# Patient Record
Sex: Female | Born: 1975
Health system: Southern US, Community
[De-identification: ages and names within clinical notes are randomized; demographics above are authoritative.]

## PROBLEM LIST (undated history)

## (undated) DIAGNOSIS — G8929 Other chronic pain: Secondary | ICD-10-CM

## (undated) DIAGNOSIS — M542 Cervicalgia: Secondary | ICD-10-CM

## (undated) DIAGNOSIS — G43909 Migraine, unspecified, not intractable, without status migrainosus: Secondary | ICD-10-CM

## (undated) HISTORY — DX: Other chronic pain: G89.29

## (undated) HISTORY — PX: NASAL SINUS SURGERY: SHX719

## (undated) HISTORY — PX: HAND SURGERY: SHX662

## (undated) HISTORY — DX: Migraine, unspecified, not intractable, without status migrainosus: G43.909

## (undated) HISTORY — DX: Cervicalgia: M54.2

---

## 2004-05-04 ENCOUNTER — Other Ambulatory Visit: Admission: RE | Admit: 2004-05-04 | Discharge: 2004-05-04 | Payer: Self-pay | Admitting: Obstetrics and Gynecology

## 2005-06-15 ENCOUNTER — Other Ambulatory Visit: Admission: RE | Admit: 2005-06-15 | Discharge: 2005-06-15 | Payer: Self-pay | Admitting: Obstetrics and Gynecology

## 2010-02-19 ENCOUNTER — Ambulatory Visit: Payer: Self-pay | Admitting: Family Medicine

## 2010-02-19 DIAGNOSIS — M542 Cervicalgia: Secondary | ICD-10-CM | POA: Insufficient documentation

## 2010-02-19 DIAGNOSIS — G43011 Migraine without aura, intractable, with status migrainosus: Secondary | ICD-10-CM | POA: Insufficient documentation

## 2010-03-03 ENCOUNTER — Encounter: Admission: RE | Admit: 2010-03-03 | Discharge: 2010-06-01 | Payer: Self-pay | Admitting: Family Medicine

## 2010-03-26 ENCOUNTER — Ambulatory Visit: Payer: Self-pay | Admitting: Family Medicine

## 2010-03-26 DIAGNOSIS — G43909 Migraine, unspecified, not intractable, without status migrainosus: Secondary | ICD-10-CM | POA: Insufficient documentation

## 2010-04-09 ENCOUNTER — Encounter: Payer: Self-pay | Admitting: Family Medicine

## 2010-04-13 ENCOUNTER — Encounter: Payer: Self-pay | Admitting: Family Medicine

## 2010-12-09 NOTE — Miscellaneous (Signed)
Summary: PT Discharge/[facility]  PT Discharge/[facility]   Imported By: Lanelle Bal 04/20/2010 08:02:32  _____________________________________________________________________  External Attachment:    Type:   Image     Comment:   External Document

## 2010-12-09 NOTE — Assessment & Plan Note (Signed)
Summary: migraines   Vital Signs:  Patient profile:   35 year old female Height:      68 inches Weight:      194 pounds BMI:     29.60 O2 Sat:      100 % on Room air Pulse rate:   81 / minute BP sitting:   122 / 80  (left arm) Cuff size:   large  Vitals Entered By: Payton Spark CMA (Mar 26, 2010 10:22 AM)  O2 Flow:  Room air CC: F/u migraines. Discuss meds. Also c/o allergies.    Primary Care Provider:  Nani Gasser MD  CC:  F/u migraines. Discuss meds. Also c/o allergies. .  History of Present Illness: 35 yo WF presents for f/u migraine q 1-2 wks.  She started PT 1 month ago which has started helping her neck pain.  She is getting mild to moderate HA 2 x a wk.  She is having some allergy problems.  She takes benadryl everyday for flare ups and she is on immunotherapy.  The Flexeril did help her HA/ neck pain.  Her sumatriptan did help but it made her feel 'spacy' for 2 days afterward.  She has seen a chiropractor on and off thru the years after a fall of a horse in middle school.  Xrays have shown loss of cervical lordosis.  She is using a TENS unit.      Current Medications (verified): 1)  Cyclobenzaprine Hcl 10 Mg Tabs (Cyclobenzaprine Hcl) .... Take 1 Tablet By Mouth Three Times A Day As Needed Muscle Spasm. 2)  Sumatriptan Succinate 100 Mg Tabs (Sumatriptan Succinate) .... Take 1 Tablet By Mouth Once A Day As Needed For Migraine. Can Repeat The Dose in 1-2 Hours If Needed.  Allergies (verified): 1)  ! Sulfa  Past History:  Past Medical History: migraines  chronic neck pain  Social History: Reviewed history from 02/19/2010 and no changes required. 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 Married to [place] with 3 kids ( 1 set of twins).  Never Smoked Alcohol use-no Drug use-no Regular exercise-no  Review of Systems      See HPI  Physical Exam  General:  alert, well-developed, well-nourished, well-hydrated, and overweight-appearing.   Head:   normocephalic and atraumatic.   Eyes:  pupils equal, pupils round, and pupils reactive to light.   Ears:  no external deformities.   Nose:  no nasal discharge.   Mouth:  good dentition and pharynx pink and moist.   Neck:  supple, full ROM, and no masses.  tight trapezious muscles.  tender subocciptal muscles Lungs:  Normal respiratory effort, chest expands symmetrically. Lungs are clear to auscultation, no crackles or wheezes. Heart:  Normal rate and regular rhythm. S1 and S2 normal without gallop, murmur, click, rub or other extra sounds. Neurologic:  no tremor Skin:  color normal.   Cervical Nodes:  No lymphadenopathy noted Psych:  good eye contact, not anxious appearing, and not depressed appearing.     Impression & Recommendations:  Problem # 1:  MIGRAINE HEADACHE (ICD-346.90) She was having SEs from the Sumatriptan so I changed her to Naratriptan for rescue.  Continue to use Flexeril along with it and use Excedrin migraine for mild to moderate HAs.  Since her total HA count/month is >10, will start prophylactic treatment with Topamax.  She will start at 25 mg at night x 1 wk then go up to 50 mg dose.  Call if any problems.  RTC in 2  mos. Her updated medication list for this problem includes:    Naratriptan Hcl 2.5 Mg Tabs (Naratriptan hcl) .Marland Kitchen... 1 tab by mouth x 1 as needed migraine headache; repeat in 4 hrs if needed.  Problem # 2:  NECK PAIN (ICD-723.1) In PT.  slightly improved with PT/ tens unit and Flexeril.  she may need to get back into chiropractor for problems iwth loss of cervical lordosis.  Explained that neck pain can certainly be a trigger for her migraines. Her updated medication list for this problem includes:    Cyclobenzaprine Hcl 10 Mg Tabs (Cyclobenzaprine hcl) .Marland Kitchen... Take 1 tablet by mouth three times a day as needed muscle spasm.  Complete Medication List: 1)  Cyclobenzaprine Hcl 10 Mg Tabs (Cyclobenzaprine hcl) .... Take 1 tablet by mouth three times a day as  needed muscle spasm. 2)  Naratriptan Hcl 2.5 Mg Tabs (Naratriptan hcl) .Marland Kitchen.. 1 tab by mouth x 1 as needed migraine headache; repeat in 4 hrs if needed. 3)  Topiramate 50 Mg Tabs (Topiramate) .... 1/2 tab by mouth at bedtime x 1 wk then 1 tab by mouth qhs  Patient Instructions: 1)  Start Topiramate at bedtime daily for migraine prevention. 2)  Use Naratriptan + Flexeril at onset of severe migraine headache. 3)  Use Excedrin migraine at onset of mild to moderate headache. 4)  Continue PT for neck pain. 5)  Add Nasonex daily for allergies and f/u with Dr Corinda Gubler. 6)  Return for f/u migraines in 6 wks. Prescriptions: TOPIRAMATE 50 MG TABS (TOPIRAMATE) 1/2 tab by mouth at bedtime x 1 wk then 1 tab by mouth qhs  #30 x 1   Entered and Authorized by:   Seymour Bars DO   Signed by:   Seymour Bars DO on 03/26/2010   Method used:   Electronically to        [facility]  [id number] (retail)       [address]       [place], [place]  [address]       Ph: [phone]       Fax: [phone]   RxID:   [id number] NARATRIPTAN HCL 2.5 MG TABS (NARATRIPTAN HCL) 1 tab by mouth x 1 as needed migraine headache; repeat in 4 hrs if needed.  #9 x 1   Entered and Authorized by:   Seymour Bars DO   Signed by:   Seymour Bars DO on 03/26/2010   Method used:   Electronically to        [facility] [phone]* (retail)       [address]       [place], [place]  [address]       Ph: [phone]       Fax: [phone]   RxID:   [id number] CYCLOBENZAPRINE HCL 10 MG TABS (CYCLOBENZAPRINE HCL) Take 1 tablet by mouth three times a day as needed muscle spasm.  #40 x 1   Entered and Authorized by:   Seymour Bars DO   Signed by:   Seymour Bars DO on 03/26/2010   Method used:   Electronically to        [facility] [phone]* (retail)       [address]       [place], [place]  [address]       Ph: [phone]       Fax: [phone]   RxID:   [id number]

## 2010-12-09 NOTE — Miscellaneous (Signed)
Summary: [facility]/ [facility]  PT Initial [facility]   Imported By: Lanelle Bal 04/20/2010 07:56:42  _____________________________________________________________________  External Attachment:    Type:   Image     Comment:   External Document

## 2010-12-09 NOTE — Assessment & Plan Note (Signed)
Summary: NOV: Neck pain, migraine   Vital Signs:  Patient profile:   35 year old female Height:      68 inches Weight:      193 pounds BMI:     29.45 Pulse rate:   70 / minute BP sitting:   106 / 66  (left arm) Cuff size:   large  Vitals Entered By: Sonya Lucas (February 19, 2010 2:13 PM) CC: NP_ left sided neck pain- triggers headaches ? stress at work, Headache Is Patient Diabetic? No   Primary Care Provider:  Nani Gasser MD  CC:  NP_ left sided neck pain- triggers headaches ? stress at work and Headache.  History of Present Illness: NP_ left sided neck pain- triggers headaches ? stress at work. Bucked off a hoarse years ago and had a neck injury.  Occ neck will pop and will get very tight muscles that results in migraine.  HAs seen neurology adn a chiropracter.  Using excedrin migraine to take the edge off.  Work has been stressful.  Has had a HA all weak at this point. Using a TENS unit and heat which is helping. Feels tense and tight. On a computer all day at work.  Usually get 2 HA a month.  Some nausea, dizziness, slurred speech.  No vomiting. Light and sounds sensitivity. Dull throbbing behind her left eye. Not sure has tried tryptan.   Habits & Providers  Alcohol-Tobacco-Diet     Alcohol drinks/day: <1     Tobacco Status: never  Exercise-Depression-Behavior     Does Patient Exercise: no     Have you felt down or hopeless? yes     STD Risk: never     Drug Use: no     Seat Belt Use: always  Current Medications (verified): 1)  None  Allergies (verified): 1)  ! Sulfa  Comments:  Nurse/Medical Assistant: The patient's medications and allergies were reviewed with the patient and were updated in the Medication and Allergy Lists. 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  Never  Smoked Alcohol use-no Drug use-no Regular exercise-no Smoking Status:  never Does Patient Exercise:  no STD Risk:  never Drug Use:  no Seat Belt Use:  always  Review of Systems       No fever/sweats/weakness, unexplained weight loss/gain.  No vison changes.  No difficulty hearing/ringing in ears, hay fever/allergies.  No chest pain/discomfort, palpitations.  No Br lump/nipple discharge.  No cough/wheeze.  No blood in BM, nausea/vomiting/diarrhea.  No nighttime urination, leaking urine, unusual vaginal bleeding, discharge (penis or vagina).  No muscle/joint pain. No rash, change in mole.  + HA, no memory loss.  No anxiety, sleep d/o, depression.  No easy bruising/bleeding, unexplained lump.    Physical Exam  General:  Well-developed,well-nourished,in no acute distress; alert,appropriate and cooperative throughout examination Head:  Normocephalic and atraumatic without obvious abnormalities. No apparent alopecia or balding. Eyes:  No corneal or conjunctival inflammation noted. EOMI. Perrla. Lungs:  Normal respiratory effort, chest expands symmetrically. Lungs are clear to auscultation, no crackles or wheezes. Heart:  Normal rate and regular rhythm. S1 and S2 normal without gallop, murmur, click, rub or other extra sounds. Msk:  Neck with normal flexion, extenstion. Slight dec rotation to the left and normal side bending. Tender at the base of  the skul and over the traps bilat. They are also tendse and tighht.   Pulses:  Radial 2+ bilat.  Skin:  no rashes.   Cervical Nodes:  No lymphadenopathy noted Psych:  Cognition and judgment appear intact. Alert and cooperative with normal attention span and concentration. No apparent delusions, illusions, hallucinations   Impression & Recommendations:  Problem # 1:  MIGRAINE W/O AURA W/INTRACT W/STATUS MIGRAINOSUS (ICD-346.13)  Discussed treatment options.  Trial of imitrex.  Will refer to PT to work on her chronic neck pain. Also discussed  potential role of trigger point in her neck.  For her acute HA will try flexeril and the imitrex and see if helps.  F/U in 1 months.  Her updated medication list for this problem includes:    Sumatriptan Succinate 100 Mg Tabs (Sumatriptan succinate) .Sonya Lucas... Take 1 tablet by mouth once a day as needed for migraine. can repeat the dose in 1-2 hours if needed.  Orders: Physical Therapy Referral (PT)  Problem # 2:  NECK PAIN (ICD-723.1)  Will refer to PT. Trial of muscle relaxer three times a day as needed . Discussed  importance of lookgin at the long term solution to her pain and I think this is where pt will help. In meantime continue the use of heat and the TENs unit.   Her updated medication list for this problem includes:    Cyclobenzaprine Hcl 10 Mg Tabs (Cyclobenzaprine hcl) .Sonya Lucas... Take 1 tablet by mouth three times a day as needed muscle spasm.  Orders: Physical Therapy Referral (PT)  Complete Medication List: 1)  Cyclobenzaprine Hcl 10 Mg Tabs (Cyclobenzaprine hcl) .... Take 1 tablet by mouth three times a day as needed muscle spasm. 2)  Sumatriptan Succinate 100 Mg Tabs (Sumatriptan succinate) .... Take 1 tablet by mouth once a day as needed for migraine. can repeat the dose in 1-2 hours if needed.  Patient Instructions: 1)  Please schedule a follow-up appointment in 1 month to see how migraines are doing 2)  Call me Monday if not feeling better.  Prescriptions: SUMATRIPTAN SUCCINATE 100 MG TABS (SUMATRIPTAN SUCCINATE) Take 1 tablet by mouth once a day as needed for migraine. Can repeat the dose in 1-2 hours if needed.  #6 x 0   Entered and Authorized by:   Sonya Gasser MD   Signed by:   Sonya Gasser MD on 02/19/2010   Method used:   Electronically to        [facility]  [address] [phone]* (retail)       [address]       [place], [place]  [address]       Ph: [phone]       Fax: [phone]   RxID:   [id number] CYCLOBENZAPRINE HCL 10 MG TABS (CYCLOBENZAPRINE HCL)  Take 1 tablet by mouth three times a day as needed muscle spasm.  #30 x 0   Entered and Authorized by:   Sonya Gasser MD   Signed by:   Sonya Gasser MD on 02/19/2010   Method used:   Electronically to        [facility]  [address] [phone]* (retail)       [address]       [place], [place]  [address]       Ph: [phone]       Fax: [phone]   RxID:   [id number]

## 2010-12-09 NOTE — Letter (Signed)
Summary: Out of Work  [facility]  [address]   [place], [place] [address]   Phone: [phone]  Fax: [phone]    February 19, 2010   Employee:  [employer]    To Whom It May Concern:   For Medical reasons, please excuse the above named employee from work for the following dates:  Start:   02/19/2010  End:   02/23/2010  If you need additional information, please feel free to contact our office.         Sincerely,    Nani Gasser MD

## 2011-03-30 LAB — HM PAP SMEAR

## 2011-07-27 ENCOUNTER — Encounter: Payer: Self-pay | Admitting: Family Medicine

## 2011-07-28 ENCOUNTER — Ambulatory Visit (INDEPENDENT_AMBULATORY_CARE_PROVIDER_SITE_OTHER): Payer: BC Managed Care – PPO | Admitting: Family Medicine

## 2011-07-28 ENCOUNTER — Encounter: Payer: Self-pay | Admitting: Family Medicine

## 2011-07-28 DIAGNOSIS — J45909 Unspecified asthma, uncomplicated: Secondary | ICD-10-CM | POA: Insufficient documentation

## 2011-07-28 DIAGNOSIS — J40 Bronchitis, not specified as acute or chronic: Secondary | ICD-10-CM

## 2011-07-28 DIAGNOSIS — J309 Allergic rhinitis, unspecified: Secondary | ICD-10-CM | POA: Insufficient documentation

## 2011-07-28 MED ORDER — HYDROCODONE-HOMATROPINE 5-1.5 MG/5ML PO SYRP: 5.0000 mL | ORAL_SOLUTION | Freq: Every evening | ORAL | Status: AC | PRN

## 2011-07-28 MED ORDER — ALBUTEROL SULFATE (2.5 MG/3ML) 0.083% IN NEBU
2.5000 mg | INHALATION_SOLUTION | Freq: Four times a day (QID) | RESPIRATORY_TRACT | Status: DC | PRN
  Administered 2011-07-28: 2.5 mg via RESPIRATORY_TRACT

## 2011-07-28 MED ORDER — FLUTICASONE PROPIONATE 50 MCG/ACT NA SUSP: 2.0000 | Freq: Every day | NASAL | Status: DC

## 2011-07-28 MED ORDER — AZITHROMYCIN 250 MG PO TABS: ORAL_TABLET | ORAL | Status: AC

## 2011-07-28 MED ORDER — NARATRIPTAN HCL 2.5 MG PO TABS: 2.5000 mg | ORAL_TABLET | ORAL | Status: DC | PRN

## 2011-07-28 NOTE — Patient Instructions (Signed)
Call if not better in 10 days.   

## 2011-07-28 NOTE — Progress Notes (Signed)
  Subjective:    Patient ID: Sonya Lucas, female    DOB: Dec 11, 1975, 35 y.o.   MRN: [id number]  Cough The current episode started in the past 7 days. The problem has been gradually worsening. The problem occurs hourly. Associated symptoms include chest pain, postnasal drip, rhinorrhea and shortness of breath. Pertinent negatives include no ear congestion, ear pain, fever, myalgias or sore throat. Exacerbated by: talking. Treatments tried: benadryl. The treatment provided mild relief. Her past medical history is significant for asthma.      Review of Systems  Constitutional: Negative for fever.  HENT: Positive for rhinorrhea and postnasal drip. Negative for ear pain and sore throat.   Respiratory: Positive for cough and shortness of breath.   Cardiovascular: Positive for chest pain.  Musculoskeletal: Negative for myalgias.       Objective:   Physical Exam  Constitutional: She is oriented to person, place, and time. She appears well-developed and well-nourished.  HENT:  Head: Normocephalic and atraumatic.  Right Ear: External ear normal.  Left Ear: External ear normal.  Nose: Nose normal.  Mouth/Throat: Oropharynx is clear and moist.       TMs and canals are clear.   Eyes: Conjunctivae and EOM are normal. Pupils are equal, round, and reactive to light.  Neck: Neck supple. No thyromegaly present.  Cardiovascular: Normal rate, regular rhythm and normal heart sounds.   Pulmonary/Chest: Effort normal and breath sounds normal. She has no wheezes.  Lymphadenopathy:    She has no cervical adenopathy.  Neurological: She is alert and oriented to person, place, and time.  Skin: Skin is warm and dry.  Psychiatric: She has a normal mood and affect.          Assessment & Plan:  Bronchitis-this could be viral. Patient had a lot of postnasal drip. Her peak flow is in the yellow zone today so we could give her nebulizer treatment. Her peak flow improved from 310-370. We gave her a sample  albuterol inhaler to use at home. 2-4 puffs every 4 hours as needed for chest tightness, wheezing or shortness of breath. Because she is leaving town for a week I also gave her prescription for an antibiotic to fill if she suddenly gets worse. Also recommend getting her back on her allergy medications as it has clearly been a trigger for her. I recommend over-the-counter Claritin, Zyrtec or Allegra. I also sent a prescription for generic nasal steroid spray.

## 2011-07-29 ENCOUNTER — Telehealth: Payer: Self-pay | Admitting: Family Medicine

## 2011-07-29 NOTE — Telephone Encounter (Signed)
Pt called in this am and said she was in yesterday and a cough control med was suppose to have been sent yesterday, and when she went to pup it was not at the pharm. Now, the husband is stopping by the office to check on. Plan:  While the husband is here called the [facility] pharm and  The medication is at the pharm.  Pts husband informed. Jarvis Newcomer, LPN [facility]

## 2011-08-11 ENCOUNTER — Telehealth: Payer: Self-pay | Admitting: Family Medicine

## 2011-08-11 MED ORDER — AMOXICILLIN-POT CLAVULANATE 875-125 MG PO TABS: 1.0000 | ORAL_TABLET | Freq: Two times a day (BID) | ORAL | Status: AC

## 2011-08-11 NOTE — Telephone Encounter (Signed)
Pt informed. Maynard David, LPN /Triage  

## 2011-08-11 NOTE — Telephone Encounter (Signed)
Ok will call in new ABX. Also need to add antihistamine.

## 2011-08-11 NOTE — Telephone Encounter (Signed)
Pt calling stating she was seen two weeks ago and was told to call back if no better.  C/O cough to point of vomiting, hard to breathe, afebrile however.  Pt did feel the antibiotic and took that, and used nasal spray, but not using the albuterol inhaler because it immediately makes her cough, and no antihistamine taken as recommended. Please advise if pt needs to be brought back in this am or sent to UC or to call add'l med. Plan:  Routed to Dr. Marlyne Beards, LPN [facility]

## 2011-10-20 ENCOUNTER — Encounter: Payer: Self-pay | Admitting: *Deleted

## 2011-10-20 ENCOUNTER — Emergency Department
Admission: EM | Admit: 2011-10-20 | Discharge: 2011-10-20 | Disposition: A | Discharge status: Home / Self Care | Payer: BC Managed Care – PPO | Source: Home / Self Care | Attending: Family Medicine | Admitting: Family Medicine

## 2011-10-20 DIAGNOSIS — R05 Cough: Secondary | ICD-10-CM

## 2011-10-20 DIAGNOSIS — R053 Chronic cough: Secondary | ICD-10-CM

## 2011-10-20 DIAGNOSIS — J111 Influenza due to unidentified influenza virus with other respiratory manifestations: Secondary | ICD-10-CM

## 2011-10-20 DIAGNOSIS — R6889 Other general symptoms and signs: Secondary | ICD-10-CM

## 2011-10-20 DIAGNOSIS — R059 Cough, unspecified: Secondary | ICD-10-CM

## 2011-10-20 MED ORDER — CLARITHROMYCIN 500 MG PO TABS: 500.0000 mg | ORAL_TABLET | Freq: Two times a day (BID) | ORAL | Status: AC

## 2011-10-20 MED ORDER — BENZONATATE 200 MG PO CAPS: 200.0000 mg | ORAL_CAPSULE | Freq: Every day | ORAL | Status: AC

## 2011-10-20 NOTE — ED Provider Notes (Signed)
History     CSN: [id number] Arrival date & time: 10/20/2011 10:05 AM   First MD Initiated Contact with Patient 10/20/11 1030      Chief Complaint  Patient presents with  . Otalgia  . Cough    HPI Comments: HPI : Flu symptoms for about 5 days. Fever with chills, sweats, myalgias, fatigue, headache. Symptoms are progressively worsening, despite trying OTC fever reducing medicine and rest and fluids. Has decreased appetite, but tolerating some liquids by mouth.  She reports that prior to present illness, she had had a persistent non-productive cough for at least a month, responding poorly to albuterol inhaler.  She sometimes coughs until she gags.  She does not remember her last tetanus shot.  Review of Systems: Positive for fatigue, mild nasal congestion, mild sore throat, mild swollen anterior neck glands, mild cough.  Developed right earache today Negative for acute vision changes, stiff neck, focal weakness, syncope, seizures, respiratory distress, vomiting, diarrhea, GU symptoms.   Patient is a 35 y.o. female presenting with ear pain and cough. The history is provided by the patient.  Otalgia Associated symptoms include cough.  Cough Associated symptoms include ear pain.    Past Medical History  Diagnosis Date  . Migraines   . Chronic neck pain     Past Surgical History  Procedure Date  . Hand surgery     right  . Nasal sinus surgery     Family History  Problem Relation Age of Onset  . Breast cancer      grandmother  . Diabetes      grandfather  . Hyperlipidemia Father     History  Substance Use Topics  . Smoking status: Never Smoker   . Smokeless tobacco: Not on file  . Alcohol Use: No    OB History    Grav Para Term Preterm Abortions TAB SAB Ect Mult Living                  Review of Systems  HENT: Positive for ear pain.   Respiratory: Positive for cough.     Allergies  Sulfonamide derivatives  Home Medications   Current Outpatient Rx    Name Route Sig Dispense Refill  . BENZONATATE 200 MG PO CAPS Oral Take 1 capsule (200 mg total) by mouth at bedtime. Take as needed for cough 12 capsule 0  . CLARITHROMYCIN 500 MG PO TABS Oral Take 1 tablet (500 mg total) by mouth 2 (two) times daily. 14 tablet 0  . FLUTICASONE PROPIONATE 50 MCG/ACT NA SUSP Nasal Place 2 sprays into the nose daily. 16 g 6  . NARATRIPTAN HCL 2.5 MG PO TABS Oral Take 1 tablet (2.5 mg total) by mouth as needed. Take one (1) tablet at onset of headache; if returns or does not resolve, may repeat after 4 hours; do not exceed five (5) mg in 24 hours. 10 tablet 2    BP 119/80  Pulse 78  Temp(Src) 98.7 F (37.1 C) (Oral)  Resp 14  Ht 5\' 8"  (1.727 m)  Wt 189 lb (85.73 kg)  BMI 28.74 kg/m2  SpO2 99%  Physical Exam Nursing notes and Vital Signs reviewed. Appearance:  Patient appears healthy, stated age, and in no acute distress Eyes:  Pupils are equal, round, and reactive to light and accomodation.  Extraocular movement is intact.  Conjunctivae are not inflamed  Ears:  Canals normal.  Tympanic membranes normal.  Nose:  Mildly congested turbinates.  No sinus tenderness.  Pharynx:  Normal  Neck:  Supple.  Slightly tender shotty posterior nodes are palpated bilaterally  Lungs:  Clear to auscultation.  Breath sounds are equal.  Heart:  Regular rate and rhythm without murmurs, rubs, or gallops. Chest:  Distinct tenderness to palpation over the mid-sternum.  Abdomen:  Nontender without masses or hepatosplenomegaly.  Bowel sounds are present.  No CVA or flank tenderness.  Extremities:  No edema.  No calf tenderness Skin:  No rash present.   ED Course  Procedures  none      1. Influenza-like illness   2. Persistent cough       MDM  Suspect that her chronic cough may represent ? Pertussis Begin Biaxin.  Begin expectorant/decongestant, topical decongestant, saline nasal spray and/or saline irrigation, and cough suppressant at bedtime.  Avoid  antihistamines for now. Increase fluid intake, rest. Recommend Tdap and flu shot when well. Followup with PCP if not improving 7 to 10 days.         Donna Christen, MD 10/20/11 1101

## 2011-10-20 NOTE — ED Notes (Signed)
Patient c/o ear pain dry cough, body aches, chills and congestion x 5 days.

## 2012-01-10 ENCOUNTER — Ambulatory Visit (INDEPENDENT_AMBULATORY_CARE_PROVIDER_SITE_OTHER): Payer: BC Managed Care – PPO | Admitting: Physician Assistant

## 2012-01-10 ENCOUNTER — Encounter: Payer: Self-pay | Admitting: Physician Assistant

## 2012-01-10 VITALS — BP 122/86 | HR 101 | Temp 97.7°F | Ht 68.0 in | Wt 194.0 lb

## 2012-01-10 DIAGNOSIS — J40 Bronchitis, not specified as acute or chronic: Secondary | ICD-10-CM

## 2012-01-10 DIAGNOSIS — R062 Wheezing: Secondary | ICD-10-CM

## 2012-01-10 MED ORDER — HYDROCODONE-HOMATROPINE 5-1.5 MG/5ML PO SYRP: 2.5000 mL | ORAL_SOLUTION | Freq: Every evening | ORAL | Status: AC | PRN

## 2012-01-10 MED ORDER — AZITHROMYCIN 250 MG PO TABS: ORAL_TABLET | ORAL | Status: AC

## 2012-01-10 NOTE — Progress Notes (Signed)
  Subjective:    Patient ID: Sonya Lucas, female    DOB: 02/02/1976, 36 y.o.   MRN: [id number]  HPI Patient presents to the clinic today because she has not felt great for 2 months. She has had on and off again congestion both nasal and chest. Her cough has gotten worse for the last 7 days.her cough is productive with thick clears sputum with occasional yellow tinting. She does report increasing shortness of breath mostly at night. She has had to use her albuterol inhaler on average once every 2 days. She states that albuterol helps some. She has also used Dayquil and Sudafed.both of these have helped minimally.  She denies fever, nausea, vomiting, or headache. She has had some chills and muscle aches.  Review of Systems     Objective:   Physical Exam  Constitutional: She is oriented to person, place, and time. She appears well-developed and well-nourished.  HENT:  Head: Normocephalic and atraumatic.  Right Ear: External ear normal.  Left Ear: External ear normal.  Nose: Nose normal.  Mouth/Throat: Oropharynx is clear and moist. No oropharyngeal exudate.       TMs normal. Negative for maxillary tenderness to palpation.  Eyes: Conjunctivae are normal.  Neck:       Enlarged anterior cervical lymph nodes tenderness on the left side to palpation.  Cardiovascular: Regular rhythm and normal heart sounds.        Tachycardia at 101.  Pulmonary/Chest: Effort normal.       Course breath sounds bilaterally with wheezing heard with expiration and left upper lobe.  Neurological: She is alert and oriented to person, place, and time.  Psychiatric: She has a normal mood and affect. Her behavior is normal.          Assessment & Plan:  Bronchitis-patient was given Zpak. Hycodan for cough to use at bedtime.patient encouraged to use Mucinex twice a day for the next 2 days and drink lots of water. Encouraged her cough is much she could during the day and only use cough syrup at night. followup if not  improving minutes 3 days her by Friday.  Discussed need for complete physical. Patient is aware.

## 2012-01-10 NOTE — Patient Instructions (Signed)
Start Zpak today for bronchitis. Use albuterol inhaler as needed for shortness of breath. Continue Mucinex for next two days twice a day drinking lots of water. Use hycodan at night for cough.

## 2012-01-13 ENCOUNTER — Telehealth: Payer: Self-pay | Admitting: *Deleted

## 2012-01-13 NOTE — Telephone Encounter (Signed)
Pt called back and states that her and her husband will be transferring to another family provider. States that she doesn't think it is right for her to have to come back in tomorrow to f/u with Jade instead of getting another medication. States the last time she got sick that we kept sending in multiple meds and about "killed her with all of the meds we gave her.  Pt will not be following up tomorrow with Lesly Rubenstein.  FYI

## 2012-01-13 NOTE — Telephone Encounter (Signed)
Pt seen Jade on Monday and was given a zpack. Pt states that her throat is very sore and states that her rt ear still hurting putting bad today. Pt would like to know what else we can do. Please advise.

## 2012-01-13 NOTE — Telephone Encounter (Signed)
OK, she can do what she feels is best for her care.

## 2012-01-13 NOTE — Telephone Encounter (Signed)
Pt informed

## 2012-01-13 NOTE — Telephone Encounter (Signed)
Haver her f/u tomorrow w/ Lesly Rubenstein for ear recheck.

## 2012-12-14 DIAGNOSIS — J343 Hypertrophy of nasal turbinates: Secondary | ICD-10-CM | POA: Insufficient documentation

## 2013-06-15 ENCOUNTER — Ambulatory Visit (INDEPENDENT_AMBULATORY_CARE_PROVIDER_SITE_OTHER): Payer: BC Managed Care – PPO | Admitting: Family Medicine

## 2013-06-15 ENCOUNTER — Encounter: Payer: Self-pay | Admitting: Family Medicine

## 2013-06-15 VITALS — BP 121/80 | HR 81 | Wt 213.0 lb

## 2013-06-15 DIAGNOSIS — F411 Generalized anxiety disorder: Secondary | ICD-10-CM

## 2013-06-15 LAB — TSH: TSH: 3.091 u[IU]/mL (ref 0.350–4.500)

## 2013-06-15 MED ORDER — CITALOPRAM HYDROBROMIDE 20 MG PO TABS: ORAL_TABLET | ORAL | Status: DC

## 2013-06-15 NOTE — Progress Notes (Signed)
CC: Sonya Lucas is a 37 y.o. female is here for Stress   Subjective: HPI:  Patient complains of stress and anxiety that has been present for at least the last month worsening on a weekly basis and is now moderate in severity on a daily basis. It is worsened with recent job changes, she is no longer over seeing a project she's been working on for years however is somehow still being held responsible for new short falls that are occurring despite her clearly not being at fault. She finds herself more irritable at home, small stressors from her children are getting in the way of her quality of life when in the past this was never a problem. Stress often causes her to feel subjectively ill and dizzy and she has even missed work on occasion because of these symptoms. Symptoms are improved with the support of her husband. She's also noticed that she's having more hair fall out that she's used to since the stress has worsened. She is having sleep disturbances waking early in the morning with worries about job tasks that still need to be completed.    She denies fevers, chills, unintentional weight loss or gain, GI disturbance, depression, nor thoughts of wanting to harm herself or others. There is no recreational drug use she rarely drinks alcohol   Review Of Systems Outlined In HPI  Past Medical History  Diagnosis Date  . Migraines   . Chronic neck pain      Family History  Problem Relation Age of Onset  . Breast cancer      grandmother  . Diabetes      grandfather  . Hyperlipidemia Father      History  Substance Use Topics  . Smoking status: Never Smoker   . Smokeless tobacco: Not on file  . Alcohol Use: No     Objective: Filed Vitals:   06/15/13 0815  BP: 121/80  Pulse: 81    Vital signs reviewed. General: Alert and Oriented, No Acute Distress HEENT: Pupils equal, round, reactive to light. Conjunctivae clear.  External ears unremarkable.  Moist mucous membranes. Lungs:  Clear and comfortable work of breathing, speaking in full sentences without accessory muscle use. Cardiac: Regular rate and rhythm.  Neuro: CN II-XII grossly intact, gait normal. Extremities: No peripheral edema.  Strong peripheral pulses.  Mental Status: No depression, anxiety, nor agitation. Logical though process. Skin: Warm and dry. Assessment & Plan: Sonya Lucas was seen today for stress.  Diagnoses and associated orders for this visit:  Generalized anxiety disorder - citalopram (CELEXA) 20 MG tablet; Half tab by mouth daily for four days then full tab daily. - TSH    Generalized anxiety disorder: We discussed formal therapy along with medication interventions she would prefer to start with citalopram and is agreeable to checking TSH especially in light of hair loss.  Return in about 4 weeks (around 07/13/2013).

## 2013-07-06 ENCOUNTER — Ambulatory Visit: Payer: BC Managed Care – PPO | Admitting: Family Medicine

## 2014-02-25 ENCOUNTER — Encounter: Payer: Self-pay | Admitting: Physician Assistant

## 2014-02-25 ENCOUNTER — Ambulatory Visit (INDEPENDENT_AMBULATORY_CARE_PROVIDER_SITE_OTHER): Payer: BC Managed Care – PPO | Admitting: Physician Assistant

## 2014-02-25 VITALS — BP 152/79 | HR 103 | Temp 98.2°F | Ht 68.0 in | Wt 212.0 lb

## 2014-02-25 DIAGNOSIS — H1045 Other chronic allergic conjunctivitis: Secondary | ICD-10-CM

## 2014-02-25 DIAGNOSIS — J309 Allergic rhinitis, unspecified: Secondary | ICD-10-CM

## 2014-02-25 DIAGNOSIS — J302 Other seasonal allergic rhinitis: Secondary | ICD-10-CM | POA: Insufficient documentation

## 2014-02-25 DIAGNOSIS — J45909 Unspecified asthma, uncomplicated: Secondary | ICD-10-CM

## 2014-02-25 DIAGNOSIS — H1013 Acute atopic conjunctivitis, bilateral: Secondary | ICD-10-CM

## 2014-02-25 MED ORDER — BECLOMETHASONE DIPROPIONATE 80 MCG/ACT NA AERS: 2.0000 | INHALATION_SPRAY | Freq: Every day | NASAL | Status: DC

## 2014-02-25 MED ORDER — AZELASTINE HCL 0.05 % OP SOLN: 1.0000 [drp] | Freq: Two times a day (BID) | OPHTHALMIC | Status: DC

## 2014-02-25 MED ORDER — METHYLPREDNISOLONE SODIUM SUCC 125 MG IJ SOLR
125.0000 mg | Freq: Once | INTRAMUSCULAR | Status: AC
  Administered 2014-02-25: 125 mg via INTRAMUSCULAR

## 2014-02-25 MED ORDER — MONTELUKAST SODIUM 10 MG PO TABS: 10.0000 mg | ORAL_TABLET | Freq: Every day | ORAL | Status: DC

## 2014-02-25 NOTE — Progress Notes (Signed)
   Subjective:    Patient ID: Sonya Lucas, female    DOB: Jul 03, 1976, 38 y.o.   MRN: [id number]  HPI Pt is a 38 yo female who presents to the clinic with bilateral itchy, watery eyes, sinus pressure, ST and cough. She has a hx of asthma but denies any wheezing. She is taking OTC [employer] brand anti-histamine with little to no relief. Symptoms start about 4 days ago. She has really bad sesonal allergies. She used to get 2 allergy shots a week. Both children have had allergy exacerbations that have since cleared. Sinus pressure seems to be improving today after taking a decongestant. Her itchy, draining eye seem to be the most concerning symptoms today. She reports this morning yellowish strings of discharge. No eye pain or vision changes. She has been using warm compresses.  Denies any fever, chills, nausea.     Review of Systems     Objective:   Physical Exam  Constitutional: She is oriented to person, place, and time. She appears well-developed and well-nourished.  HENT:  Head: Normocephalic and atraumatic.  Right Ear: External ear normal.  Left Ear: External ear normal.  TM's clear.  Oropharynx erythematous with PND. No tonsilar exudate or swelling.  Bilateral nasal turbinates red and swollen.   Eyes:  Swollen around bilateral eyes. Bilateral injected conjunctiva. Discharge bilateral seemed to be clear and watery at visit.   Neck: Normal range of motion. Neck supple.  Cardiovascular: Regular rhythm and normal heart sounds.   Tachycardia at 103.   Pulmonary/Chest: Effort normal and breath sounds normal. She has no wheezes.  Lymphadenopathy:    She has no cervical adenopathy.  Neurological: She is alert and oriented to person, place, and time.  Psychiatric: She has a normal mood and affect. Her behavior is normal.          Assessment & Plan:  Seasonal allergies/asthma/allergic conjunctivitis and rhinitis- treated with solumedrol 125mg  IM today. Pt needs to start Singulair which  can help with allergies and asthma symptoms. Continue with zyrtec or claritin OTC. Start qnasl daily. Start optivar for allergic eyes bid. With overall physical exam seems more allergic than bacterial. If not improving or worsening call office.

## 2014-02-25 NOTE — Patient Instructions (Signed)
Start eye drops twice a day.  Start singulair daily in combination with zyrtec/claritin.  Start qnasl daily.   Follow up if not improving.

## 2014-02-28 ENCOUNTER — Emergency Department
Admission: EM | Admit: 2014-02-28 | Discharge: 2014-02-28 | Disposition: A | Discharge status: Home / Self Care | Payer: BC Managed Care – PPO | Source: Home / Self Care | Attending: Family Medicine | Admitting: Family Medicine

## 2014-02-28 ENCOUNTER — Encounter: Payer: Self-pay | Admitting: Emergency Medicine

## 2014-02-28 ENCOUNTER — Telehealth: Payer: Self-pay | Admitting: *Deleted

## 2014-02-28 DIAGNOSIS — J069 Acute upper respiratory infection, unspecified: Secondary | ICD-10-CM

## 2014-02-28 DIAGNOSIS — J302 Other seasonal allergic rhinitis: Secondary | ICD-10-CM

## 2014-02-28 MED ORDER — CETIRIZINE HCL 10 MG PO CAPS: 10.0000 mg | ORAL_CAPSULE | Freq: Every day | ORAL | Status: DC

## 2014-02-28 MED ORDER — PREDNISONE 50 MG PO TABS: ORAL_TABLET | ORAL | Status: DC

## 2014-02-28 MED ORDER — AZITHROMYCIN 250 MG PO TABS: ORAL_TABLET | ORAL | Status: DC

## 2014-02-28 NOTE — ED Notes (Signed)
Sore throat, hoarseness

## 2014-02-28 NOTE — ED Provider Notes (Signed)
CSN: [id number]     Arrival date & time 02/28/14  1724 History   First MD Initiated Contact with Patient 02/28/14 1728     Chief Complaint  Patient presents with  . Sore Throat    HPI  URI Symptoms Onset: 5-6 days  Description: rhinorrhea, sore throat, cough  Modifying factors:  Was seen earlier in the week by PCP. Was given steroid shot and oral antihistamines. Sxs mildly improved since this point. Still with severe sore throat and drainage/cough   Symptoms Nasal discharge: yes Fever: no Sore throat: yes Cough: yes Wheezing: no Ear pain: no GI symptoms: no Sick contacts: yes  Red Flags  Stiff neck: no Dyspnea: no Rash: no Swallowing difficulty: no  Sinusitis Risk Factors Headache/face pain: no Double sickening: no tooth pain: no  Allergy Risk Factors Sneezing: yes Itchy scratchy throat: no Seasonal symptoms: yes  Flu Risk Factors Headache: no muscle aches: no severe fatigue: no   Past Medical History  Diagnosis Date  . Migraines   . Chronic neck pain    Past Surgical History  Procedure Laterality Date  . Hand surgery      right  . Nasal sinus surgery     Family History  Problem Relation Age of Onset  . Breast cancer      grandmother  . Diabetes      grandfather  . Hyperlipidemia Father    History  Substance Use Topics  . Smoking status: Never Smoker   . Smokeless tobacco: Not on file  . Alcohol Use: No   OB History   Grav Para Term Preterm Abortions TAB SAB Ect Mult Living                 Review of Systems  All other systems reviewed and are negative.   Allergies  Sulfonamide derivatives  Home Medications   Prior to Admission medications   Medication Sig Start Date End Date Taking? Authorizing Provider  azelastine (OPTIVAR) 0.05 % ophthalmic solution Place 1 drop into both eyes 2 (two) times daily. 02/25/14   Jade L Breeback, PA-C  azithromycin (ZITHROMAX) 250 MG tablet Take 2 tabs PO x 1 dose, then 1 tab PO QD x 4 days 02/28/14    Doree AlbeeSteven Amyla Heffner, MD  Beclomethasone Dipropionate (QNASL) 80 MCG/ACT AERS Place 2 sprays into both nostrils daily. 02/25/14   Jade L Breeback, PA-C  Cetirizine HCl 10 MG CAPS Take 1 capsule (10 mg total) by mouth daily. 02/28/14   Doree AlbeeSteven Jeyda Siebel, MD  montelukast (SINGULAIR) 10 MG tablet Take 1 tablet (10 mg total) by mouth at bedtime. 02/25/14   Jade L Breeback, PA-C  predniSONE (DELTASONE) 50 MG tablet 1 tab daily x 5 days 02/28/14   Doree AlbeeSteven Jaidan Stachnik, MD   BP 125/85  Pulse 80  Temp(Src) 98.2 F (36.8 C) (Oral)  Ht 5\' 8"  (1.727 m)  Wt 216 lb (97.977 kg)  BMI 32.85 kg/m2  SpO2 99% Physical Exam  Constitutional: She is oriented to person, place, and time. She appears well-developed and well-nourished.  HENT:  Head: Normocephalic and atraumatic.  Right Ear: External ear normal.  Left Ear: External ear normal.  +nasal erythema, rhinorrhea bilaterally, + post oropharyngeal erythema    Eyes: Conjunctivae are normal. Pupils are equal, round, and reactive to light.  Neck: Normal range of motion. Neck supple.  Cardiovascular: Normal rate and regular rhythm.   Pulmonary/Chest: Effort normal and breath sounds normal. She has no wheezes.  Abdominal: Soft.  Musculoskeletal: Normal range of motion.  Neurological: She is alert and oriented to person, place, and time.  Skin: Skin is warm.    ED Course  Procedures (including critical care time) Labs Review Labs Reviewed - No data to display  Results for orders placed in visit on 06/15/13  TSH      Result Value Ref Range   TSH 3.091  0.350 - 4.500 uIU/mL   Imaging Review No results found.   MDM   1. URI (upper respiratory infection)   2. Seasonal allergies    Suspect overlapping viral and allergic sxs.  Will place on extended course of prednisone.  Oral antihistamine  Discussed supportive care and infectious/resp red flags PPx rx for zpak if sxs worsen.  Follow up as needed.     The patient and/or caregiver has been counseled  thoroughly with regard to treatment plan and/or medications prescribed including dosage, schedule, interactions, rationale for use, and possible side effects and they verbalize understanding. Diagnoses and expected course of recovery discussed and will return if not improved as expected or if the condition worsens. Patient and/or caregiver verbalized understanding.         Doree AlbeeSteven Clay Menser, MD 02/28/14 1754

## 2014-02-28 NOTE — ED Notes (Signed)
Patient said sore throat started yesterday and  refused swab

## 2014-02-28 NOTE — Telephone Encounter (Signed)
Pt states that she is feeling worse since her visit Monday.  Jade saw her & ordered solu-medrol, qnasl, and for pt to start singulair.  She has been doing this & states the qnasl helps a lot but then just stops working.  Now she was a really bad sore throat & it's very painful to swallow as well as having a painful bump on the top of her mouth.

## 2014-03-01 NOTE — Telephone Encounter (Signed)
Left detailed message on vm.

## 2014-03-01 NOTE — Telephone Encounter (Signed)
Follow up with pt. methney routed to me this am. I see went to urgent care and felt symptoms were consistent with allergies. Please let me know if not improving.

## 2014-04-23 ENCOUNTER — Encounter: Payer: Self-pay | Admitting: Physician Assistant

## 2014-04-23 ENCOUNTER — Ambulatory Visit (INDEPENDENT_AMBULATORY_CARE_PROVIDER_SITE_OTHER): Payer: BC Managed Care – PPO | Admitting: Physician Assistant

## 2014-04-23 VITALS — BP 128/72 | HR 85 | Ht 68.0 in | Wt 219.0 lb

## 2014-04-23 DIAGNOSIS — R599 Enlarged lymph nodes, unspecified: Secondary | ICD-10-CM

## 2014-04-23 DIAGNOSIS — M5382 Other specified dorsopathies, cervical region: Secondary | ICD-10-CM

## 2014-04-23 DIAGNOSIS — R29898 Other symptoms and signs involving the musculoskeletal system: Secondary | ICD-10-CM

## 2014-04-23 MED ORDER — CYCLOBENZAPRINE HCL 10 MG PO TABS: 10.0000 mg | ORAL_TABLET | Freq: Three times a day (TID) | ORAL | Status: DC | PRN

## 2014-04-23 NOTE — Patient Instructions (Signed)
Cool compresses.  Ibuprofen for next week.  Will get CBC.   Swollen Lymph Nodes The lymphatic system filters fluid from around cells. It is like a system of blood vessels. These channels carry lymph instead of blood. The lymphatic system is an important part of the immune (disease fighting) system. When people talk about "swollen glands in the neck," they are usually talking about swollen lymph nodes. The lymph nodes are like the little traps for infection. You and your caregiver may be able to feel lymph nodes, especially swollen nodes, in these common areas: the groin (inguinal area), armpits (axilla), and above the clavicle (supraclavicular). You may also feel them in the neck (cervical) and the back of the head just above the hairline (occipital). Swollen glands occur when there is any condition in which the body responds with an allergic type of reaction. For instance, the glands in the neck can become swollen from insect bites or any type of minor infection on the head. These are very noticeable in children with only minor problems. Lymph nodes may also become swollen when there is a tumor or problem with the lymphatic system, such as Hodgkin's disease. TREATMENT   Most swollen glands do not require treatment. They can be observed (watched) for a short period of time, if your caregiver feels it is necessary. Most of the time, observation is not necessary.  Antibiotics (medicines that kill germs) may be prescribed by your caregiver. Your caregiver may prescribe these if he or she feels the swollen glands are due to a bacterial (germ) infection. Antibiotics are not used if the swollen glands are caused by a virus. HOME CARE INSTRUCTIONS   Take medications as directed by your caregiver. Only take over-the-counter or prescription medicines for pain, discomfort, or fever as directed by your caregiver. SEEK MEDICAL CARE IF:   If you begin to run a temperature greater than 102 F (38.9 C), or as  your caregiver suggests. MAKE SURE YOU:   Understand these instructions.  Will watch your condition.  Will get help right away if you are not doing well or get worse. Document Released: 10/15/2002 Document Revised: 01/17/2012 Document Reviewed: 10/25/2005 [facility] Patient Information 2014 [place], [place].

## 2014-04-24 LAB — CBC WITH DIFFERENTIAL/PLATELET
BASOS ABS: 0 10*3/uL (ref 0.0–0.1)
Basophils Relative: 0 % (ref 0–1)
EOS ABS: 0.2 10*3/uL (ref 0.0–0.7)
EOS PCT: 3 % (ref 0–5)
HCT: 39.4 % (ref 36.0–46.0)
Hemoglobin: 13 g/dL (ref 12.0–15.0)
Lymphocytes Relative: 36 % (ref 12–46)
Lymphs Abs: 2.6 10*3/uL (ref 0.7–4.0)
MCH: 29 pg (ref 26.0–34.0)
MCHC: 33 g/dL (ref 30.0–36.0)
MCV: 87.9 fL (ref 78.0–100.0)
Monocytes Absolute: 0.4 10*3/uL (ref 0.1–1.0)
Monocytes Relative: 6 % (ref 3–12)
NEUTROS PCT: 55 % (ref 43–77)
Neutro Abs: 4 10*3/uL (ref 1.7–7.7)
PLATELETS: 213 10*3/uL (ref 150–400)
RBC: 4.48 MIL/uL (ref 3.87–5.11)
RDW: 13.4 % (ref 11.5–15.5)
WBC: 7.3 10*3/uL (ref 4.0–10.5)

## 2014-04-24 NOTE — Progress Notes (Signed)
   Subjective:    Patient ID: Sonya Lucas, female    DOB: 06/26/1976, 38 y.o.   MRN: [id number]  HPI Patient 38 year old female who presents to the clinic with a long on the back of her left neck. It is not tender to touch. She has noticed that for the past week. She does feel like it could be getting a bit bigger but she states it's hard to tell. Other than neck tension and tightness she has had no other symptoms. She denies any fever, chills, sinus pressure, ear pain, sore throat, cough, shortness of breath or wheezing. She's not had anything like this before. She denies any itching or burning of the neck or scalp. She has not tried anything to make better and nothing seems to be making worse directly.   Review of Systems  All other systems reviewed and are negative.      Objective:   Physical Exam  Constitutional: She is oriented to person, place, and time. She appears well-developed and well-nourished.  HENT:  Head: Normocephalic and atraumatic.  Eyes: Conjunctivae and EOM are normal. Pupils are equal, round, and reactive to light. Right eye exhibits no discharge. Left eye exhibits no discharge.  Neck: Normal range of motion. Neck supple. No thyromegaly present.    Full range of motion of neck in all directions without pain.  No cervical or axial lymphadenopathy.  Cardiovascular: Normal rate, regular rhythm and normal heart sounds.   Pulmonary/Chest: Effort normal and breath sounds normal.  Lymphadenopathy:    She has no cervical adenopathy.  Neurological: She is alert and oriented to person, place, and time.  Skin: Skin is dry.  Psychiatric: She has a normal mood and affect. Her behavior is normal.          Assessment & Plan:  Lymph node enlargement-lump seems consistent with a reactive lymph node. Could also be lipoma. Will get a CBC. Reassured by no signs of infection or lacerations or scrapes. Discuss with patient to keep her hands off the node. She can check for size  increase every week. If not improving in the next 4 weeks we could consider ultrasound. Consider cold compresses over won't as well as ibuprofen for the next 7 days. Reassured patient that no other lymph node enlargement was detected on today's exam. There were no signs of any bacterial infection of the upper respiratory system.  Neck tightness- encouraged massage. Give flexeril to use at bedtime. Good pillow. ROM exercises given. Discussed I do not think neck tightness caused node/mass in neck.

## 2015-03-17 ENCOUNTER — Encounter: Payer: Self-pay | Admitting: Physician Assistant

## 2015-03-17 ENCOUNTER — Ambulatory Visit (INDEPENDENT_AMBULATORY_CARE_PROVIDER_SITE_OTHER): Payer: BLUE CROSS/BLUE SHIELD | Admitting: Physician Assistant

## 2015-03-17 VITALS — BP 142/83 | HR 83 | Temp 98.1°F | Ht 68.0 in | Wt 210.0 lb

## 2015-03-17 DIAGNOSIS — J301 Allergic rhinitis due to pollen: Secondary | ICD-10-CM | POA: Diagnosis not present

## 2015-03-17 DIAGNOSIS — J320 Chronic maxillary sinusitis: Secondary | ICD-10-CM | POA: Diagnosis not present

## 2015-03-17 MED ORDER — FLUTICASONE PROPIONATE 50 MCG/ACT NA SUSP: 2.0000 | Freq: Every day | NASAL | Status: DC

## 2015-03-17 MED ORDER — AMOXICILLIN-POT CLAVULANATE 875-125 MG PO TABS: 1.0000 | ORAL_TABLET | Freq: Two times a day (BID) | ORAL | Status: DC

## 2015-03-17 MED ORDER — METHYLPREDNISOLONE SODIUM SUCC 125 MG IJ SOLR
125.0000 mg | Freq: Once | INTRAMUSCULAR | Status: AC
  Administered 2015-03-17: 125 mg via INTRAMUSCULAR

## 2015-03-17 NOTE — Patient Instructions (Signed)

## 2015-03-17 NOTE — Progress Notes (Signed)
   Subjective:    Patient ID: Sonya Lucas, female    DOB: 1976-04-12, 39 y.o.   MRN: [id number]  HPI  Pt is a 39 yo female who presents to the clinic with right sided facial pain, jaw pain and ear pressure for last 5 days. Hx of allergies and asthma but currently not taking any medications. She has a little bit of productive cough. She has had some blood in nasal discharge. No fever, chills, nausea, vomiting, SOB or wheezing. She has not tried anything OTC for sinus relief. No nasal saline washes.     Review of Systems  All other systems reviewed and are negative.      Objective:   Physical Exam  Constitutional: She is oriented to person, place, and time. She appears well-developed and well-nourished.  HENT:  Head: Normocephalic and atraumatic.  TM's slightly erythematous bilaterally. No blood or pus.   Right extreme maxillary tenderness to palpation.  Negative left tenderness to palpation.   No visible abnormalities to teeth or gums. No abscess.   Bilateral nasal turbinates red and swollen.   Oropharynx erythematous with no tonsilar swelling or exudate.   Eyes: Conjunctivae are normal. Right eye exhibits no discharge. Left eye exhibits no discharge.  Neck: Normal range of motion. Neck supple.  Cardiovascular: Normal rate, regular rhythm and normal heart sounds.   Pulmonary/Chest: Effort normal and breath sounds normal. She has no wheezes.  Lymphadenopathy:    She has no cervical adenopathy.  Neurological: She is alert and oriented to person, place, and time.  Skin: Skin is dry.  Flushed bilateral cheeks.   Psychiatric: She has a normal mood and affect. Her behavior is normal.          Assessment & Plan:  Acute right sided maxillary sinusitis- treated with augmentin and solumedrol 125mg  IM. Discussed adding flonase 2 sprays each nostril to help with allergic symptoms. I do think allergies are playing a role in symptoms. After resolved consider going on zyrtec or  claritin daily.

## 2015-09-16 ENCOUNTER — Encounter: Payer: Self-pay | Admitting: Osteopathic Medicine

## 2015-09-16 ENCOUNTER — Ambulatory Visit (INDEPENDENT_AMBULATORY_CARE_PROVIDER_SITE_OTHER): Payer: BLUE CROSS/BLUE SHIELD | Admitting: Osteopathic Medicine

## 2015-09-16 VITALS — BP 129/74 | HR 105 | Temp 98.2°F | Wt 214.0 lb

## 2015-09-16 DIAGNOSIS — J069 Acute upper respiratory infection, unspecified: Secondary | ICD-10-CM

## 2015-09-16 NOTE — Patient Instructions (Signed)
DR. Mardelle MatteALEXANDER'S HOME CARE INSTRUCTIONS:  UPPER RESPIRATORY ILLNESS AND SINUSITIS   TREAT SINUS CONGESTION, RUNNY NOSE & POSTNASAL DRIP: treat to increase airflow through sinuses, decrease congestion pain and prevent bacterial growth! Remember, only 0.5-2% of sinus infections are due to a bacteria, the rest are due to a virus (usually the common cold)! Trust your doctor when he or she decides whether or not you really need an antibiotic!   NASAL SPRAYS: generally safe, won't interact with other medicines. FLONASE (FLUTICASONA) - 2 sprays twice per day ATROVENT (IPRATROPUIM) (PRESCRIPTION) - as directed on bottle AFRIN (OXYMETOLAZONE) - use sparingly because it will cause rebound congestion, NEVER USE IN KIDS  SALINE NASAL SPRAY- no limit but avoid use after other nasal sprays  ANTIHISTAMINES: Helps dry out runny nose and decreases postnasal drip. Benadryl may cause drowsiness but other preparations should not be as sedating. Certain kinds are not as safe in elderly individuals, ok to use unless Dr A says otherwise. Only use one of the following... BENADRYL (DIPHENHYDRAMINE) - 25-50 mg every 6 hours ZYRTEC (CETIRIZINE) - 5-10 mg daily CLARITIN (LORATIDINE) - less potent. 10 mg daily ALLEGRA (FEXOFENADINE) - least sedating! 180 mg daily or 60 mg twice per day  DECONGESTANTS:Helps dry out runny nose and helps with sinus pain. May cause insomnia, elevated heart rate. Can cause problems if used often in people with high blood pressure. OK to use unless Dr A says otherwise. Only use one of the following... SUDAFED (PSEUDOEPHEDINE) - 60 mg every 4 - 6 hours, also comes in 120 mg extended release every 12 hours, maximum 240 mg in 24 hours, NEVER USE IN KIDS UNDER 38 YEARS OLD  COMBINATIONS OF ANTIHISTAMINES + DECONGESTANT: ZYRTEC-D (CETIRIZINE + PSEUDOEPHEDRINE) - 12 hour formulation as directed CLARITIN-D (LORATIDINE + PSEUDOEPHEDRINE) - 12 and 24 hour formulations as directed ALLEGRA-D (FEXOFENADINE  + PSEUDOEPHEDRINE) - 12 and 24 hour formulations as directed   TREAT COUGH & SORE THROAT: Remember, cough is the body's way of protecting your airways and lungs, it's a hard-wired reflex that is tough for medicines to treat! Irritation to the airways will cause cough. This irritation is usually caused by upper airway problems like postnasal drip (treat as above) and sore throat, but in severe cases can be due to lower airway problems like bronchitis or pneumonia. Sore throat is almost always due to a virus, but occasionally can be due to Strep infection which requires antibiotics. Exercise and smoking may make cough worse - take it easy and QUIT SMOKING!   EXPECTORANT: Used to help clear airways, take these with PLENTY of water to help thin mucus secretions and make the mucus easier to cough up  ROBITUSSIN (DEXTROMETHORPHAN OR GUAIFENISEN depending on formulation)  MUCINEX (GUAIFENICEN) - longer acting Robitussin and Mucinex come in many combinations, also - ask your pharmacist if you have any questions or concerns about interactions or buying duplicate medicines!  COUGH DROPS/LOZENGES: Whichever over-the-counter agent you prefer! Here are some suggestions for ingredients to look for... BENZOCAINE - numbing effect, also in CHLORASEPTIC THROAT SPRAY MENTHOL - cooling effect  HONEY: has gone head-to-head in several clinical trials with cough medicines and found to be equally effective! Try 1 Teaspoon Honey + 2 Drops Lemon Juice, as much as you want to use. AVOID IN KIDS UNDER AGE 69  HERBAL TEA: Here are certain ingredients which help "coat the throat" to relieve pain  ELM BARK, LICORICE ROOT, MARSHMALLOW ROOT   TREAT ACHES AND PAINS, FEVER: Illness causes aches and  pains as your body increases inflammation response to help fight the infection. Stay well-rested, plenty to drink, chicken soup and the following anti-inflammaotyr mediations will help!   IBUPROFEN - 400-600 mg every 6 hours. Ibuprofen  and similar medications can cause problems for people with heart disease or history of stomach ulcers, check with Dr A first if you're concerned.   TYLENOL (ACETAMINOPHEN) - [phone] mg every 4 - 6 hours. This won't cause problems with heart or stomach.    REMEMBER! IF YOU'RE EVER CONCERNED ABOUT MEDICATION SIDE EFFECTS, IF YOU'RE EVER CONCERNED YOUR SYMPTOMS ARE GETTING WORSE, PLEASE CALL THE OFFICE!

## 2015-09-16 NOTE — Progress Notes (Signed)
HPI: Sonya Lucas is a 39 y.o. female who [facility] [place] for chief complaint of:  Chief Complaint  Patient presents with  . Nasal Congestion    . Location: SINUSES . Quality: CONGESTION . Severity: mild to moderate . Duration: 2 days . Timing: constant . Modifying factors: OTC meds allegra, sudafed aren't helping . Assoc signs/symptoms: no fever/chills, no cough, no sore throat   Past medical, social and family history reviewed: Past Medical History  Diagnosis Date  . Migraines   . Chronic neck pain    Past Surgical History  Procedure Laterality Date  . Hand surgery      right  . Nasal sinus surgery     Social History  Substance Use Topics  . Smoking status: Never Smoker   . Smokeless tobacco: Not on file  . Alcohol Use: No   Family History  Problem Relation Age of Onset  . Breast cancer      grandmother  . Diabetes      grandfather  . Hyperlipidemia Father     Current Outpatient Prescriptions  Medication Sig Dispense Refill  . amoxicillin-clavulanate (AUGMENTIN) 875-125 MG per tablet Take 1 tablet by mouth 2 (two) times daily. 20 tablet 0  . fluticasone (FLONASE) 50 MCG/ACT nasal spray Place 2 sprays into both nostrils daily. 16 g 5  . IBUPROFEN PO Take by mouth as needed.     No current facility-administered medications for this visit.   Allergies  Allergen Reactions  . Sulfonamide Derivatives       Review of Systems: CONSTITUTIONAL:  No  fever, no chills, No  unintentional weight changes HEAD/EYES/EARS/NOSE/THROAT: (+) sinus headache, no vision change, no hearing change, No  sore throat CARDIAC: No chest pain, no pressure/palpitations RESPIRATORY: No  cough, No shortness of breath/wheeze GASTROINTESTINAL: No nausea, no vomiting, no abdominal pain MUSCULOSKELETAL: No  myalgia/arthralgia   Exam:  There were no vitals taken for this visit. Constitutional: VSS, see above. General Appearance: alert,  well-developed, well-nourished, NAD Eyes: Normal lids and conjunctive, non-icteric sclera, PERRLA Ears, Nose, Mouth, Throat: Normal external inspection ears/nares/mouth/lips/gums, TM abnormal: L TM mild effusion, otherwise clear bilaterally, MMM, posterior pharynx No  erythema No  exudate Neck: No masses, trachea midline. No thyroid enlargement/tenderness/mass appreciated. No lymphadenopathy Respiratory: Normal respiratory effort. no wheeze, no rhonchi, no rales Cardiovascular: S1/S2 normal, no murmur, no rub/gallop auscultated. RRR.    No results found for this or any previous visit (from the past 72 hour(s)).    ASSESSMENT/PLAN:  Viral URI normal physical exams no alarm symptoms, patient was provided with home care instructions, over-the-counter medication recommendations, advised most likely viral rhinosinusitis, would not consider antibiotics unless she is not feeling better in 7-10 days, please return to clinic sooner if she is not better or if she is getting worse the meantime. Advised supportive care, advised over-the-counter medications will not get her to feeling 100% better but she should be able to function just fine, if she needs work note I am happy to provide this for her.     Return if symptoms worsen or fail to improve.

## 2016-06-04 ENCOUNTER — Ambulatory Visit (INDEPENDENT_AMBULATORY_CARE_PROVIDER_SITE_OTHER): Payer: BLUE CROSS/BLUE SHIELD | Admitting: Family Medicine

## 2016-06-04 VITALS — BP 137/88 | HR 81 | Temp 98.2°F | Wt 215.0 lb

## 2016-06-04 DIAGNOSIS — R599 Enlarged lymph nodes, unspecified: Secondary | ICD-10-CM | POA: Diagnosis not present

## 2016-06-04 DIAGNOSIS — R590 Localized enlarged lymph nodes: Secondary | ICD-10-CM

## 2016-06-04 NOTE — Patient Instructions (Signed)
Thank you for coming in today. I think you have a normal reactive lymph node.  We will keep an eye on it.  Let me know if it worsens or does not go away.   Lymphadenopathy Lymphadenopathy refers to swollen or enlarged lymph glands, also called lymph nodes. Lymph glands are part of your body's defense (immune) system, which protects the body from infections, germs, and diseases. Lymph glands are found in many locations in your body, including the neck, underarm, and groin.  Many things can cause lymph glands to become enlarged. When your immune system responds to germs, such as viruses or bacteria, infection-fighting cells and fluid build up. This causes the glands to grow in size. Usually, this is not something to worry about. The swelling and any soreness often go away without treatment. However, swollen lymph glands can also be caused by a number of diseases. Your health care provider may do various tests to help determine the cause. If the cause of your swollen lymph glands cannot be found, it is important to monitor your condition to make sure the swelling goes away. HOME CARE INSTRUCTIONS Watch your condition for any changes. The following actions may help to lessen any discomfort you are feeling:  Get plenty of rest.  Take medicines only as directed by your health care provider. Your health care provider may recommend over-the-counter medicines for pain.  Apply moist heat compresses to the site of swollen lymph nodes as directed by your health care provider. This can help reduce any pain.  Check your lymph nodes daily for any changes.  Keep all follow-up visits as directed by your health care provider. This is important. SEEK MEDICAL CARE IF:  Your lymph nodes are still swollen after 2 weeks.  Your swelling increases or spreads to other areas.  Your lymph nodes are hard, seem fixed to the skin, or are growing rapidly.  Your skin over the lymph nodes is red and inflamed.  You have  a fever.  You have chills.  You have fatigue.  You develop a sore throat.  You have abdominal pain.  You have weight loss.  You have night sweats. SEEK IMMEDIATE MEDICAL CARE IF:  You notice fluid leaking from the area of the enlarged lymph node.  You have severe pain in any area of your body.  You have chest pain.  You have shortness of breath.   This information is not intended to replace advice given to you by your health care provider. Make sure you discuss any questions you have with your health care provider.   Document Released: 08/03/2008 Document Revised: 11/15/2014 Document Reviewed: 05/30/2014 Elsevier Interactive Patient Education [employer]

## 2016-06-04 NOTE — Progress Notes (Signed)
       Sonya Lucas is a 40 y.o. female who presents to [facility]a [facility]: Primary Care Sports Medicine today for painful mass on neck. Patient is a one-week history of mildly painful small mass on the left posterior neck. She denies any bug bites or tick bites fevers chills nausea vomiting diarrhea weight loss or night sweats. She notes the mass is improving and is mildly painful. She has not tried any medications yet. She feels well otherwise.   Past Medical History:  Diagnosis Date  . Chronic neck pain   . Migraines    Past Surgical History:  Procedure Laterality Date  . HAND SURGERY     right  . NASAL SINUS SURGERY     Social History  Substance Use Topics  . Smoking status: Never Smoker  . Smokeless tobacco: Not on file  . Alcohol use No   family history includes Hyperlipidemia in her father.  ROS as above:  Medications: Current Outpatient Prescriptions  Medication Sig Dispense Refill  . IBUPROFEN PO Take by mouth as needed.     No current facility-administered medications for this visit.    Allergies  Allergen Reactions  . Sulfonamide Derivatives      Exam:  BP 137/88   Pulse 81   Temp 98.2 F (36.8 C) (Oral)   Wt 215 lb (97.5 kg)   BMI 32.69 kg/m  Gen: Well NAD HEENT: EOMI,  MMM Lungs: Normal work of breathing. CTABL Heart: RRR no MRG Abd: NABS, Soft. Nondistended, Nontender Exts: Brisk capillary refill, warm and well perfused. Left posterior neck: Small less than 1 cm palpable reactive lymph node in the occipitall chain  No results found for this or any previous visit (from the past 24 hour(s)). No results found.    Assessment and Plan: 40 y.o. female with reactive lymphadenopathy. Plan for watchful waiting. Return as needed.   No orders of the defined types were placed in this encounter.   Discussed warning signs or symptoms. Please see discharge instructions.  Patient expresses understanding.

## 2016-09-22 DIAGNOSIS — R51 Headache: Secondary | ICD-10-CM | POA: Diagnosis not present

## 2016-09-22 DIAGNOSIS — M9901 Segmental and somatic dysfunction of cervical region: Secondary | ICD-10-CM | POA: Diagnosis not present

## 2016-09-22 DIAGNOSIS — M546 Pain in thoracic spine: Secondary | ICD-10-CM | POA: Diagnosis not present

## 2016-09-22 DIAGNOSIS — M9902 Segmental and somatic dysfunction of thoracic region: Secondary | ICD-10-CM | POA: Diagnosis not present

## 2016-09-23 DIAGNOSIS — M9902 Segmental and somatic dysfunction of thoracic region: Secondary | ICD-10-CM | POA: Diagnosis not present

## 2016-09-23 DIAGNOSIS — M546 Pain in thoracic spine: Secondary | ICD-10-CM | POA: Diagnosis not present

## 2016-09-23 DIAGNOSIS — M9901 Segmental and somatic dysfunction of cervical region: Secondary | ICD-10-CM | POA: Diagnosis not present

## 2016-09-23 DIAGNOSIS — R51 Headache: Secondary | ICD-10-CM | POA: Diagnosis not present

## 2016-09-27 DIAGNOSIS — M9902 Segmental and somatic dysfunction of thoracic region: Secondary | ICD-10-CM | POA: Diagnosis not present

## 2016-09-27 DIAGNOSIS — M9901 Segmental and somatic dysfunction of cervical region: Secondary | ICD-10-CM | POA: Diagnosis not present

## 2016-09-27 DIAGNOSIS — R51 Headache: Secondary | ICD-10-CM | POA: Diagnosis not present

## 2016-09-27 DIAGNOSIS — M546 Pain in thoracic spine: Secondary | ICD-10-CM | POA: Diagnosis not present

## 2016-09-28 DIAGNOSIS — M9901 Segmental and somatic dysfunction of cervical region: Secondary | ICD-10-CM | POA: Diagnosis not present

## 2016-09-28 DIAGNOSIS — R51 Headache: Secondary | ICD-10-CM | POA: Diagnosis not present

## 2016-09-28 DIAGNOSIS — M546 Pain in thoracic spine: Secondary | ICD-10-CM | POA: Diagnosis not present

## 2016-09-28 DIAGNOSIS — M9902 Segmental and somatic dysfunction of thoracic region: Secondary | ICD-10-CM | POA: Diagnosis not present

## 2016-10-05 DIAGNOSIS — M9901 Segmental and somatic dysfunction of cervical region: Secondary | ICD-10-CM | POA: Diagnosis not present

## 2016-10-05 DIAGNOSIS — M546 Pain in thoracic spine: Secondary | ICD-10-CM | POA: Diagnosis not present

## 2016-10-05 DIAGNOSIS — M9902 Segmental and somatic dysfunction of thoracic region: Secondary | ICD-10-CM | POA: Diagnosis not present

## 2016-10-05 DIAGNOSIS — R51 Headache: Secondary | ICD-10-CM | POA: Diagnosis not present

## 2016-10-06 DIAGNOSIS — M9902 Segmental and somatic dysfunction of thoracic region: Secondary | ICD-10-CM | POA: Diagnosis not present

## 2016-10-06 DIAGNOSIS — M546 Pain in thoracic spine: Secondary | ICD-10-CM | POA: Diagnosis not present

## 2016-10-06 DIAGNOSIS — R51 Headache: Secondary | ICD-10-CM | POA: Diagnosis not present

## 2016-10-06 DIAGNOSIS — M9901 Segmental and somatic dysfunction of cervical region: Secondary | ICD-10-CM | POA: Diagnosis not present

## 2016-10-07 DIAGNOSIS — M9901 Segmental and somatic dysfunction of cervical region: Secondary | ICD-10-CM | POA: Diagnosis not present

## 2016-10-07 DIAGNOSIS — R51 Headache: Secondary | ICD-10-CM | POA: Diagnosis not present

## 2016-10-07 DIAGNOSIS — M546 Pain in thoracic spine: Secondary | ICD-10-CM | POA: Diagnosis not present

## 2016-10-07 DIAGNOSIS — M9902 Segmental and somatic dysfunction of thoracic region: Secondary | ICD-10-CM | POA: Diagnosis not present

## 2016-10-12 DIAGNOSIS — R51 Headache: Secondary | ICD-10-CM | POA: Diagnosis not present

## 2016-10-12 DIAGNOSIS — M546 Pain in thoracic spine: Secondary | ICD-10-CM | POA: Diagnosis not present

## 2016-10-12 DIAGNOSIS — M9902 Segmental and somatic dysfunction of thoracic region: Secondary | ICD-10-CM | POA: Diagnosis not present

## 2016-10-12 DIAGNOSIS — M9901 Segmental and somatic dysfunction of cervical region: Secondary | ICD-10-CM | POA: Diagnosis not present

## 2016-10-14 DIAGNOSIS — M9902 Segmental and somatic dysfunction of thoracic region: Secondary | ICD-10-CM | POA: Diagnosis not present

## 2016-10-14 DIAGNOSIS — M9901 Segmental and somatic dysfunction of cervical region: Secondary | ICD-10-CM | POA: Diagnosis not present

## 2016-10-14 DIAGNOSIS — R51 Headache: Secondary | ICD-10-CM | POA: Diagnosis not present

## 2016-10-14 DIAGNOSIS — M546 Pain in thoracic spine: Secondary | ICD-10-CM | POA: Diagnosis not present

## 2016-10-19 DIAGNOSIS — R51 Headache: Secondary | ICD-10-CM | POA: Diagnosis not present

## 2016-10-19 DIAGNOSIS — M9901 Segmental and somatic dysfunction of cervical region: Secondary | ICD-10-CM | POA: Diagnosis not present

## 2016-10-19 DIAGNOSIS — M546 Pain in thoracic spine: Secondary | ICD-10-CM | POA: Diagnosis not present

## 2016-10-19 DIAGNOSIS — M9902 Segmental and somatic dysfunction of thoracic region: Secondary | ICD-10-CM | POA: Diagnosis not present

## 2016-10-21 DIAGNOSIS — M546 Pain in thoracic spine: Secondary | ICD-10-CM | POA: Diagnosis not present

## 2016-10-21 DIAGNOSIS — M9902 Segmental and somatic dysfunction of thoracic region: Secondary | ICD-10-CM | POA: Diagnosis not present

## 2016-10-21 DIAGNOSIS — M9901 Segmental and somatic dysfunction of cervical region: Secondary | ICD-10-CM | POA: Diagnosis not present

## 2016-10-21 DIAGNOSIS — R51 Headache: Secondary | ICD-10-CM | POA: Diagnosis not present

## 2016-10-26 DIAGNOSIS — M9901 Segmental and somatic dysfunction of cervical region: Secondary | ICD-10-CM | POA: Diagnosis not present

## 2016-10-26 DIAGNOSIS — M546 Pain in thoracic spine: Secondary | ICD-10-CM | POA: Diagnosis not present

## 2016-10-26 DIAGNOSIS — M9902 Segmental and somatic dysfunction of thoracic region: Secondary | ICD-10-CM | POA: Diagnosis not present

## 2016-10-26 DIAGNOSIS — R51 Headache: Secondary | ICD-10-CM | POA: Diagnosis not present

## 2016-10-27 DIAGNOSIS — M9901 Segmental and somatic dysfunction of cervical region: Secondary | ICD-10-CM | POA: Diagnosis not present

## 2016-10-27 DIAGNOSIS — R51 Headache: Secondary | ICD-10-CM | POA: Diagnosis not present

## 2016-10-27 DIAGNOSIS — M546 Pain in thoracic spine: Secondary | ICD-10-CM | POA: Diagnosis not present

## 2016-10-27 DIAGNOSIS — M9902 Segmental and somatic dysfunction of thoracic region: Secondary | ICD-10-CM | POA: Diagnosis not present

## 2016-10-28 DIAGNOSIS — M9901 Segmental and somatic dysfunction of cervical region: Secondary | ICD-10-CM | POA: Diagnosis not present

## 2016-10-28 DIAGNOSIS — M546 Pain in thoracic spine: Secondary | ICD-10-CM | POA: Diagnosis not present

## 2016-10-28 DIAGNOSIS — R51 Headache: Secondary | ICD-10-CM | POA: Diagnosis not present

## 2016-10-28 DIAGNOSIS — M9902 Segmental and somatic dysfunction of thoracic region: Secondary | ICD-10-CM | POA: Diagnosis not present

## 2016-11-09 DIAGNOSIS — M9902 Segmental and somatic dysfunction of thoracic region: Secondary | ICD-10-CM | POA: Diagnosis not present

## 2016-11-09 DIAGNOSIS — M9901 Segmental and somatic dysfunction of cervical region: Secondary | ICD-10-CM | POA: Diagnosis not present

## 2016-11-09 DIAGNOSIS — M546 Pain in thoracic spine: Secondary | ICD-10-CM | POA: Diagnosis not present

## 2016-11-09 DIAGNOSIS — R51 Headache: Secondary | ICD-10-CM | POA: Diagnosis not present

## 2016-11-11 DIAGNOSIS — M9901 Segmental and somatic dysfunction of cervical region: Secondary | ICD-10-CM | POA: Diagnosis not present

## 2016-11-11 DIAGNOSIS — M9902 Segmental and somatic dysfunction of thoracic region: Secondary | ICD-10-CM | POA: Diagnosis not present

## 2016-11-11 DIAGNOSIS — M546 Pain in thoracic spine: Secondary | ICD-10-CM | POA: Diagnosis not present

## 2016-11-11 DIAGNOSIS — R51 Headache: Secondary | ICD-10-CM | POA: Diagnosis not present

## 2016-11-16 DIAGNOSIS — R51 Headache: Secondary | ICD-10-CM | POA: Diagnosis not present

## 2016-11-16 DIAGNOSIS — M9902 Segmental and somatic dysfunction of thoracic region: Secondary | ICD-10-CM | POA: Diagnosis not present

## 2016-11-16 DIAGNOSIS — M546 Pain in thoracic spine: Secondary | ICD-10-CM | POA: Diagnosis not present

## 2016-11-16 DIAGNOSIS — M9901 Segmental and somatic dysfunction of cervical region: Secondary | ICD-10-CM | POA: Diagnosis not present

## 2016-11-18 DIAGNOSIS — M9901 Segmental and somatic dysfunction of cervical region: Secondary | ICD-10-CM | POA: Diagnosis not present

## 2016-11-18 DIAGNOSIS — M546 Pain in thoracic spine: Secondary | ICD-10-CM | POA: Diagnosis not present

## 2016-11-18 DIAGNOSIS — M9902 Segmental and somatic dysfunction of thoracic region: Secondary | ICD-10-CM | POA: Diagnosis not present

## 2016-11-18 DIAGNOSIS — R51 Headache: Secondary | ICD-10-CM | POA: Diagnosis not present

## 2016-12-01 DIAGNOSIS — M9901 Segmental and somatic dysfunction of cervical region: Secondary | ICD-10-CM | POA: Diagnosis not present

## 2016-12-01 DIAGNOSIS — R51 Headache: Secondary | ICD-10-CM | POA: Diagnosis not present

## 2016-12-01 DIAGNOSIS — M546 Pain in thoracic spine: Secondary | ICD-10-CM | POA: Diagnosis not present

## 2016-12-01 DIAGNOSIS — M9902 Segmental and somatic dysfunction of thoracic region: Secondary | ICD-10-CM | POA: Diagnosis not present

## 2016-12-09 DIAGNOSIS — D3132 Benign neoplasm of left choroid: Secondary | ICD-10-CM | POA: Diagnosis not present

## 2016-12-20 DIAGNOSIS — M546 Pain in thoracic spine: Secondary | ICD-10-CM | POA: Diagnosis not present

## 2016-12-20 DIAGNOSIS — M9901 Segmental and somatic dysfunction of cervical region: Secondary | ICD-10-CM | POA: Diagnosis not present

## 2016-12-20 DIAGNOSIS — R51 Headache: Secondary | ICD-10-CM | POA: Diagnosis not present

## 2016-12-20 DIAGNOSIS — M9902 Segmental and somatic dysfunction of thoracic region: Secondary | ICD-10-CM | POA: Diagnosis not present

## 2016-12-28 DIAGNOSIS — M9901 Segmental and somatic dysfunction of cervical region: Secondary | ICD-10-CM | POA: Diagnosis not present

## 2016-12-28 DIAGNOSIS — M546 Pain in thoracic spine: Secondary | ICD-10-CM | POA: Diagnosis not present

## 2016-12-28 DIAGNOSIS — M9902 Segmental and somatic dysfunction of thoracic region: Secondary | ICD-10-CM | POA: Diagnosis not present

## 2016-12-28 DIAGNOSIS — R51 Headache: Secondary | ICD-10-CM | POA: Diagnosis not present

## 2017-07-20 DIAGNOSIS — R51 Headache: Secondary | ICD-10-CM | POA: Diagnosis not present

## 2017-07-20 DIAGNOSIS — M9901 Segmental and somatic dysfunction of cervical region: Secondary | ICD-10-CM | POA: Diagnosis not present

## 2017-07-20 DIAGNOSIS — M546 Pain in thoracic spine: Secondary | ICD-10-CM | POA: Diagnosis not present

## 2017-07-20 DIAGNOSIS — M9902 Segmental and somatic dysfunction of thoracic region: Secondary | ICD-10-CM | POA: Diagnosis not present

## 2017-09-27 DIAGNOSIS — R51 Headache: Secondary | ICD-10-CM | POA: Diagnosis not present

## 2017-09-27 DIAGNOSIS — M9901 Segmental and somatic dysfunction of cervical region: Secondary | ICD-10-CM | POA: Diagnosis not present

## 2017-09-27 DIAGNOSIS — M546 Pain in thoracic spine: Secondary | ICD-10-CM | POA: Diagnosis not present

## 2017-09-27 DIAGNOSIS — M9902 Segmental and somatic dysfunction of thoracic region: Secondary | ICD-10-CM | POA: Diagnosis not present

## 2018-01-10 DIAGNOSIS — M9902 Segmental and somatic dysfunction of thoracic region: Secondary | ICD-10-CM | POA: Diagnosis not present

## 2018-01-10 DIAGNOSIS — R51 Headache: Secondary | ICD-10-CM | POA: Diagnosis not present

## 2018-01-10 DIAGNOSIS — M9901 Segmental and somatic dysfunction of cervical region: Secondary | ICD-10-CM | POA: Diagnosis not present

## 2018-01-10 DIAGNOSIS — M546 Pain in thoracic spine: Secondary | ICD-10-CM | POA: Diagnosis not present

## 2018-03-23 ENCOUNTER — Ambulatory Visit: Payer: BLUE CROSS/BLUE SHIELD | Admitting: Family Medicine

## 2018-03-23 VITALS — BP 125/83 | HR 65 | Ht 68.0 in | Wt 182.0 lb

## 2018-03-23 DIAGNOSIS — Z23 Encounter for immunization: Secondary | ICD-10-CM | POA: Diagnosis not present

## 2018-03-23 DIAGNOSIS — L237 Allergic contact dermatitis due to plants, except food: Secondary | ICD-10-CM

## 2018-03-23 MED ORDER — FLUOCINONIDE-E 0.05 % EX CREA: 1.0000 "application " | TOPICAL_CREAM | Freq: Two times a day (BID) | CUTANEOUS | 1 refills | Status: DC

## 2018-03-23 MED ORDER — PREDNISONE 5 MG (48) PO TBPK: ORAL_TABLET | ORAL | 0 refills | Status: DC

## 2018-03-23 MED ORDER — TRIAMCINOLONE ACETONIDE 0.1 % EX CREA: 1.0000 "application " | TOPICAL_CREAM | Freq: Two times a day (BID) | CUTANEOUS | 1 refills | Status: DC

## 2018-03-23 NOTE — Patient Instructions (Signed)
Thank you for coming in today. Use the creams.  Use the tube on the hot spots.  Fill and take prednisone if not doing well.  Recheck as needed.    Poison Ivy Dermatitis Poison ivy dermatitis is inflammation of the skin that is caused by the allergens on the leaves of the poison ivy plant. The skin reaction often involves redness, swelling, blisters, and extreme itching. What are the causes? This condition is caused by a specific chemical (urushiol) found in the sap of the poison ivy plant. This chemical is sticky and can be easily spread to people, animals, and objects. You can get poison ivy dermatitis by:  Having direct contact with a poison ivy plant.  Touching animals, other people, or objects that have come in contact with poison ivy and have the chemical on them.  What increases the risk? This condition is more likely to develop in:  People who are outdoors often.  People who go outdoors without wearing protective clothing, such as closed shoes, long pants, and a long-sleeved shirt.  What are the signs or symptoms? Symptoms of this condition include:  Redness and itching.  A rash that often includes bumps and blisters. The rash usually appears 48 hours after exposure.  Swelling. This may occur if the reaction is more severe.  Symptoms usually last for 1-2 weeks. However, the first time you develop this condition, symptoms may last 3-4 weeks. How is this diagnosed? This condition may be diagnosed based on your symptoms and a physical exam. Your health care provider may also ask you about any recent outdoor activity. How is this treated? Treatment for this condition will vary depending on how severe it is. Treatment may include:  Hydrocortisone creams or calamine lotions to relieve itching.  Oatmeal baths to soothe the skin.  Over-the-counter antihistamine tablets.  Oral steroid medicine for more severe outbreaks.  Follow these instructions at home:  Take or apply  over-the-counter and prescription medicines only as told by your health care provider.  Wash exposed skin as soon as possible with soap and cold water.  Use hydrocortisone creams or calamine lotion as needed to soothe the skin and relieve itching.  Take oatmeal baths as needed. Use colloidal oatmeal. You can get this at your local pharmacy or grocery store. Follow the instructions on the packaging.  Do not scratch or rub your skin.  While you have the rash, wash clothes right after you wear them. How is this prevented?  Learn to identify the poison ivy plant and avoid contact with the plant. This plant can be recognized by the number of leaves. Generally, poison ivy has three leaves with flowering branches on a single stem. The leaves are typically glossy, and they have jagged edges that come to a point at the front.  If you have been exposed to poison ivy, thoroughly wash with soap and water right away. You have about 30 minutes to remove the plant resin before it will cause the rash. Be sure to wash under your fingernails because any plant resin there will continue to spread the rash.  When hiking or camping, wear clothes that will help you to avoid exposure on the skin. This includes long pants, a long-sleeved shirt, tall socks, and hiking boots. You can also apply preventive lotion to your skin to help limit exposure.  If you suspect that your clothes or outdoor gear came in contact with poison ivy, rinse them off outside with a garden hose before you bring them  inside your house. Contact a health care provider if:  You have open sores in the rash area.  You have more redness, swelling, or pain in the affected area.  You have redness that spreads beyond the rash area.  You have fluid, blood, or pus coming from the affected area.  You have a fever.  You have a rash over a large area of your body.  You have a rash on your eyes, mouth, or genitals.  Your rash does not improve  after a few days. Get help right away if:  Your face swells or your eyes swell shut.  You have trouble breathing.  You have trouble swallowing. This information is not intended to replace advice given to you by your health care provider. Make sure you discuss any questions you have with your health care provider. Document Released: 10/22/2000 Document Revised: 04/01/2016 Document Reviewed: 04/02/2015 Elsevier Interactive Patient Education  [employer].

## 2018-03-23 NOTE — Progress Notes (Signed)
Sonya Lucas is a 42 y.o. female who presents to [name] today for poison ivy dermatitis.  Niani he did gardening about 5 or 6 days ago.  She does not specifically remember being exposed to poison ivy but developed an itchy rash on both legs over the last several days.  The rash is consistent with poison ivy.  She is been using over-the-counter skin numbing medications which have helped a bit.  She denies fevers or chills nausea vomiting or diarrhea.  She denies any new medications soaps detergents cosmetics.   ROS as above:  Exam:  BP 125/83   Pulse 65   Ht  (1.727 m)   Wt 182 lb (82.6 kg)   BMI 27.67 kg/m  Gen: Well NAD HEENT: EOMI,  MMM Lungs: Normal work of breathing. CTABL Heart: RRR no MRG Abd: NABS, Soft. Nondistended, Nontender Exts: Brisk capillary refill, warm and well perfused.  Skin: Erythematous macular rash with some occasional vesicles or crust scattered across both legs mostly with small patches less than 2 cm across.  There is one large 4 x 8 cm patch on the left leg.    Assessment and Plan: 42 y.o. female with poison ivy dermatitis.  Plan to treat with triamcinolone cream and Lidex cream.  Printed prednisone for use as back-up.  If patient is not improving that will be our next step.  Patient can fill and take it as needed.  Additionally Tdap given prior to discharge. Records request to OB/GYN for Pap smear sent as well.   Orders Placed This Encounter  Procedures  . Tdap vaccine greater than or equal to 7yo IM   Meds ordered this encounter  Medications  . triamcinolone cream (KENALOG) 0.1 %    Sig: Apply 1 application topically 2 (two) times daily.    Dispense:  453.6 g    Refill:  1  . fluocinonide-emollient (LIDEX-E) 0.05 % cream    Sig: Apply 1 application topically 2 (two) times daily.    Dispense:  30 g    Refill:  1    . predniSONE (STERAPRED UNI-PAK 48 TAB) 5 MG (48) TBPK tablet    Sig: 12 day dosepack po    Dispense:  48 tablet    Refill:  0     Historical information moved to improve visibility of documentation.  Past Medical History:  Diagnosis Date  . Chronic neck pain   . Migraines    Past Surgical History:  Procedure Laterality Date  . HAND SURGERY     right  . NASAL SINUS SURGERY     Social History   Tobacco Use  . Smoking status: Never Smoker  Substance Use Topics  . Alcohol use: No   family history includes Breast cancer in her unknown relative; Diabetes in her unknown relative; Hyperlipidemia in her father.  Medications: Current Outpatient Medications  Medication Sig Dispense Refill  . IBUPROFEN PO Take by mouth as needed.    . fluocinonide-emollient (LIDEX-E) 0.05 % cream Apply 1 application topically 2 (two) times daily. 30 g 1  . predniSONE (STERAPRED UNI-PAK 48 TAB) 5 MG (48) TBPK tablet 12 day dosepack po 48 tablet 0  . triamcinolone cream (KENALOG) 0.1 % Apply 1 application topically 2 (two) times daily. 453.6 g 1   No current facility-administered medications for this visit.    Allergies  Allergen Reactions  . Sulfonamide Derivatives     Health Maintenance  Health Maintenance  Topic Date Due  . HIV Screening  04/04/1992  . TETANUS/TDAP  04/04/1996  . PAP SMEAR  04/04/1998  . INFLUENZA VACCINE  06/08/2018    Discussed warning signs or symptoms. Please see discharge instructions. Patient expresses understanding.

## 2018-04-05 ENCOUNTER — Encounter: Payer: Self-pay | Admitting: Family Medicine

## 2018-05-01 DIAGNOSIS — M546 Pain in thoracic spine: Secondary | ICD-10-CM | POA: Diagnosis not present

## 2018-05-01 DIAGNOSIS — M9901 Segmental and somatic dysfunction of cervical region: Secondary | ICD-10-CM | POA: Diagnosis not present

## 2018-05-01 DIAGNOSIS — R51 Headache: Secondary | ICD-10-CM | POA: Diagnosis not present

## 2018-05-01 DIAGNOSIS — M9902 Segmental and somatic dysfunction of thoracic region: Secondary | ICD-10-CM | POA: Diagnosis not present

## 2018-07-07 DIAGNOSIS — M542 Cervicalgia: Secondary | ICD-10-CM | POA: Diagnosis not present

## 2018-07-07 DIAGNOSIS — S161XXA Strain of muscle, fascia and tendon at neck level, initial encounter: Secondary | ICD-10-CM | POA: Diagnosis not present

## 2018-07-07 DIAGNOSIS — J984 Other disorders of lung: Secondary | ICD-10-CM | POA: Diagnosis not present

## 2018-07-07 DIAGNOSIS — M25511 Pain in right shoulder: Secondary | ICD-10-CM | POA: Diagnosis not present

## 2018-07-07 DIAGNOSIS — R079 Chest pain, unspecified: Secondary | ICD-10-CM | POA: Diagnosis not present

## 2018-07-07 DIAGNOSIS — G8911 Acute pain due to trauma: Secondary | ICD-10-CM | POA: Diagnosis not present

## 2018-07-07 DIAGNOSIS — R918 Other nonspecific abnormal finding of lung field: Secondary | ICD-10-CM | POA: Diagnosis not present

## 2018-07-07 DIAGNOSIS — Z881 Allergy status to other antibiotic agents status: Secondary | ICD-10-CM | POA: Diagnosis not present

## 2018-07-07 DIAGNOSIS — Z791 Long term (current) use of non-steroidal anti-inflammatories (NSAID): Secondary | ICD-10-CM | POA: Diagnosis not present

## 2018-07-08 DIAGNOSIS — M542 Cervicalgia: Secondary | ICD-10-CM | POA: Diagnosis not present

## 2018-07-08 DIAGNOSIS — R079 Chest pain, unspecified: Secondary | ICD-10-CM | POA: Diagnosis not present

## 2018-07-08 DIAGNOSIS — J984 Other disorders of lung: Secondary | ICD-10-CM | POA: Diagnosis not present

## 2018-07-08 DIAGNOSIS — M25511 Pain in right shoulder: Secondary | ICD-10-CM | POA: Diagnosis not present

## 2018-07-08 DIAGNOSIS — R918 Other nonspecific abnormal finding of lung field: Secondary | ICD-10-CM | POA: Diagnosis not present

## 2018-07-12 ENCOUNTER — Encounter: Payer: Self-pay | Admitting: Family Medicine

## 2018-07-12 ENCOUNTER — Ambulatory Visit (INDEPENDENT_AMBULATORY_CARE_PROVIDER_SITE_OTHER): Payer: Self-pay | Admitting: Family Medicine

## 2018-07-12 VITALS — BP 119/72 | HR 72 | Ht 68.0 in | Wt 177.0 lb

## 2018-07-12 DIAGNOSIS — M25511 Pain in right shoulder: Secondary | ICD-10-CM

## 2018-07-12 DIAGNOSIS — M25552 Pain in left hip: Secondary | ICD-10-CM

## 2018-07-12 DIAGNOSIS — M25562 Pain in left knee: Secondary | ICD-10-CM

## 2018-07-12 DIAGNOSIS — M549 Dorsalgia, unspecified: Secondary | ICD-10-CM

## 2018-07-12 DIAGNOSIS — M25561 Pain in right knee: Secondary | ICD-10-CM

## 2018-07-12 DIAGNOSIS — S161XXA Strain of muscle, fascia and tendon at neck level, initial encounter: Secondary | ICD-10-CM

## 2018-07-12 MED ORDER — TRAMADOL HCL 50 MG PO TABS: 50.0000 mg | ORAL_TABLET | Freq: Three times a day (TID) | ORAL | 0 refills | Status: DC | PRN
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
## 2018-07-12 NOTE — Patient Instructions (Signed)
Okay to increase ibuprofen to 600 mg 3 times a day.  Also sent a prescription for tramadol to use as needed.

## 2018-07-19 DIAGNOSIS — Z124 Encounter for screening for malignant neoplasm of cervix: Secondary | ICD-10-CM | POA: Diagnosis not present

## 2018-07-19 DIAGNOSIS — Z30433 Encounter for removal and reinsertion of intrauterine contraceptive device: Secondary | ICD-10-CM | POA: Diagnosis not present

## 2018-07-19 DIAGNOSIS — Z882 Allergy status to sulfonamides status: Secondary | ICD-10-CM | POA: Diagnosis not present

## 2018-07-19 DIAGNOSIS — Z01419 Encounter for gynecological examination (general) (routine) without abnormal findings: Secondary | ICD-10-CM | POA: Diagnosis not present

## 2018-07-21 ENCOUNTER — Ambulatory Visit (INDEPENDENT_AMBULATORY_CARE_PROVIDER_SITE_OTHER): Payer: BLUE CROSS/BLUE SHIELD | Admitting: Family Medicine

## 2018-07-21 ENCOUNTER — Ambulatory Visit (INDEPENDENT_AMBULATORY_CARE_PROVIDER_SITE_OTHER): Payer: BLUE CROSS/BLUE SHIELD

## 2018-07-21 ENCOUNTER — Encounter: Payer: Self-pay | Admitting: Family Medicine

## 2018-07-21 VITALS — BP 140/79 | HR 74 | Ht 68.0 in | Wt 179.0 lb

## 2018-07-21 DIAGNOSIS — S8991XA Unspecified injury of right lower leg, initial encounter: Secondary | ICD-10-CM | POA: Diagnosis not present

## 2018-07-21 DIAGNOSIS — M25561 Pain in right knee: Secondary | ICD-10-CM
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
## 2018-07-21 NOTE — Patient Instructions (Signed)
Thank you for coming in today.  Apply the gel to the knee 4x daily.  Recheck in a few weeks if not getting better.      Pes Anserine Bursitis The pes anserine is an area on the inside of your knee, just below the joint, which is cushioned by a fluid-filled sac (bursa). Pes anserine bursitis is a condition that happens when this bursa gets swollen and irritated. The condition causes knee pain. What are the causes? This condition may be caused by:  Making the same movement over and over.  A hit to the inside of the leg.  What increases the risk? This injury is most likely to develop in:  Runners.  Athletes who play sports that involve a lot of running and quick side-to-side movements (cutting).  Athletes who play contact sports.  People who swim using an inward angle of the knee, such as with the breaststroke.  People with tight hamstring muscles.  Females.  People who are overweight.  People with flat feet.  People who have diabetes or osteoarthritis.  What are the signs or symptoms? Symptoms of this condition include:  Knee pain that gets better with rest and worse with activities like climbing stairs, walking, running, or getting in and out of a chair (common).  Swelling.  Warmth.  Tenderness when pressing at the inside of the lower leg, just below the knee.  How is this diagnosed? This condition may be diagnosed based on:  Your symptoms.  Your medical history.  A physical exam.  Tests, such as: ? X-rays. ? MRI and ultrasound. These tests are done to check for swelling and fluid buildup in the bursa and to look at muscles and tendons.  During your physical exam, your health care provider will press on the tendon attachment to see if you feel pain. He or she may also check your hip and knee motion and strength. How is this treated? This condition may be treated by:  Resting your knee.  Avoiding activities that cause pain.  Icing the inside of your  knee.  Raising (elevating) your knee while resting.  Sleeping with a pillow between your knees. This will cushion your injured knee.  Taking medicine to reduce pain and swelling.  Having medicines injected into your knee.  Doing strengthening and stretching exercises (physical therapy).  If these treatments do not work or if the condition keeps coming back, you may need to have surgery to remove the bursa. Follow these instructions at home: Managing pain, stiffness, and swelling  If directed, apply ice to your knee. ? Put ice in a plastic bag. ? Place a towel between your skin and the bag. ? Leave the ice on for 20 minutes, 2-3 times a day.  While you are sitting, elevate your knee.  While you are lying down, elevate your knee above the level of your heart.  Take over-the-counter and prescription medicines only as told by your health care provider. Activity  Return to your normal activities as told by your health care provider. Ask your health care provider what activities are safe for you.  Do exercises as told by your health care provider. General instructions  Sleep with a pillow between your knees.  Do not use any tobacco products, such as cigarettes, chewing tobacco, and e-cigarettes. Tobacco can delay healing. If you need help quitting, ask your health care provider.  Keep all follow-up visits as told by your health care provider. This is important. How is this prevented?  Warm up and stretch before being active.  Cool down and stretch after being active.  Give your body time to rest between periods of activity.  Make sure to use equipment that fits you.  Be safe and responsible while being active to avoid falls.  Do at least 150 minutes of moderate-intensity exercise each week, such as brisk walking or water aerobics.  Maintain physical fitness, including: ? Strength. ? Flexibility. ? Cardiovascular fitness. ? Endurance. Contact a health care provider  if:  Your symptoms do not improve.  Your symptoms get worse. This information is not intended to replace advice given to you by your health care provider. Make sure you discuss any questions you have with your health care provider. Document Released: 10/25/2005 Document Revised: 06/29/2016 Document Reviewed: 10/10/2015 Elsevier Interactive Patient Education  2018 [employer]   Pes Anserine Bursitis Rehab Ask your health care provider which exercises are safe for you. Do exercises exactly as told by your health care provider and adjust them as directed. It is normal to feel mild stretching, pulling, tightness, or discomfort as you do these exercises, but you should stop right away if you feel sudden pain or your pain gets worse.Do not begin these exercises until told by your health care provider. Stretching and range of motion exercises These exercises warm up your muscles and joints and improve the movement and flexibility of your knee. These exercises also help to relieve pain and stiffness. Exercise A: Hamstring, doorway  1. Lie on your back in front of a doorway with your left / right leg resting against the wall and your other leg flat on the floor in the doorway. There should be a slight bend in your left / right knee. 2. Straighten your left / right knee. You should feel a stretch behind your knee or thigh. If you do not, scoot your buttocks closer to the door. 3. Hold this position for __________ seconds. Repeat __________ times. Complete this stretch __________ times a day. Exercise B: V-sit ( hamstrings and adductors) 1. Sit on the floor with your legs extended in a large "V" shape. Keep your knees straight during this exercise. 2. Keeping your head and back in a straight line, bend at your waist to reach for your left foot (position A). You should feel a stretch in your right inner thigh. 3. Hold for __________ seconds. Then slowly return to the upright position. 4. Keeping  your head and back in a straight line, bend at your waist to reach forward (position B). You should feel a stretch behind both of your thighs or knees. 5. Hold for __________ seconds. Then slowly return to the upright position. 6. Keeping your head and back in a straight line, bend at your waist to reach for your right foot (position C). You should feel a stretch in your left inner thigh. 7. Hold for __________ seconds. Then slowly return to the upright position. Repeat __________ times. Complete this exercise __________ times a day. Exercise C: Quadriceps, prone  1. Lie on your abdomen on a firm surface, such as a bed or padded floor. 2. Bend your left / right knee and hold your ankle. If you cannot reach your ankle or pant leg, loop a belt around your foot and grab the belt instead. 3. Gently pull your heel toward your buttocks. Your knee should not slide out to the side. You should feel a stretch in the front of your thigh and knee. 4. Hold this position for __________  seconds. Repeat __________ times. Complete this stretch __________ times a day. Strengthening exercises These exercises build strength and endurance in your knee and hip. Endurance is the ability to use your muscles for a long time, even after they get tired. Exercise D: Terminal knee extension, quadriceps 1. Secure a long loop of rubber exercise band around a sturdy object like a table leg. 2. Put the band behind your left / right knee. Step back from where the band is secured to put tension on the band. 3. Slowly bend your left / right knee. Keep your left / right foot flat on the floor. 4. Tighten the muscle in the front of your thigh and push back against the band to straighten your knee. 5. Hold this position for __________ seconds. 6. Return to having your left / right knee bent. Repeat __________ times. Complete this stretch __________ times a day. Exercise E: Straight leg raises ( hip abductors) 1. Lie on your side  with your left / right leg in the top position. Your head, shoulder, knee, and hip should line up. You may bend your bottom knee to help you balance. 2. Lift your top leg 4-6 inches (10-15 cm) while keeping your toes pointed straight ahead. 3. Hold this position for __________ seconds. 4. Slowly lower your leg to the starting position. 5. Allow your muscles to relax completely after each repetition. Repeat __________ times. Complete this exercise __________ times a day. This information is not intended to replace advice given to you by your health care provider. Make sure you discuss any questions you have with your health care provider. Document Released: 10/25/2005 Document Revised: 06/29/2016 Document Reviewed: 10/10/2015 Elsevier Interactive Patient Education  [employer].

## 2018-08-02 ENCOUNTER — Ambulatory Visit (INDEPENDENT_AMBULATORY_CARE_PROVIDER_SITE_OTHER): Payer: BLUE CROSS/BLUE SHIELD | Admitting: Rehabilitative and Restorative Service Providers"

## 2018-08-02 ENCOUNTER — Encounter: Payer: Self-pay | Admitting: Family Medicine

## 2018-08-02 ENCOUNTER — Encounter: Payer: Self-pay | Admitting: Rehabilitative and Restorative Service Providers"

## 2018-08-02 ENCOUNTER — Ambulatory Visit: Payer: BLUE CROSS/BLUE SHIELD | Admitting: Family Medicine

## 2018-08-02 DIAGNOSIS — M25511 Pain in right shoulder: Secondary | ICD-10-CM

## 2018-08-02 DIAGNOSIS — S161XXA Strain of muscle, fascia and tendon at neck level, initial encounter: Secondary | ICD-10-CM | POA: Diagnosis not present

## 2018-08-02 DIAGNOSIS — M542 Cervicalgia: Secondary | ICD-10-CM | POA: Diagnosis not present

## 2018-08-02 DIAGNOSIS — R29898 Other symptoms and signs involving the musculoskeletal system: Secondary | ICD-10-CM

## 2018-08-02 DIAGNOSIS — M25561 Pain in right knee: Secondary | ICD-10-CM | POA: Diagnosis not present

## 2018-08-02 DIAGNOSIS — R293 Abnormal posture: Secondary | ICD-10-CM | POA: Diagnosis not present
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
## 2018-08-02 NOTE — Patient Instructions (Signed)
[id number]    TENS UNIT: This is helpful for muscle pain and spasm.   Search and Purchase a TENS 7000 2nd edition at www.tenspros.com. It should be less than $30.     TENS unit instructions: Do not shower or bathe with the unit on Turn the unit off before removing electrodes or batteries If the electrodes lose stickiness add a drop of water to the electrodes after they are disconnected from the unit and place on plastic sheet. If you continued to have difficulty, call the TENS unit company to purchase more electrodes. Do not apply lotion on the skin area prior to use. Make sure the skin is clean and dry as this will help prolong the life of the electrodes. After use, always check skin for unusual red areas, rash or other skin difficulties. If there are any skin problems, does not apply electrodes to the same area. Never remove the electrodes from the unit by pulling the wires. Do not use the TENS unit or electrodes other than as directed. Do not change electrode placement without consultating your therapist or physician. Keep 2 fingers with between each electrode.   HEP - on med bridge - did not print chart copy   Axial Extension (Chin Tuck)    Pull chin in and lengthen back of neck. Hold __5__ seconds while counting out loud. Repeat __10__ times. Do __several__ sessions per day.  Shoulder Blade Squeeze    Rotate shoulders back, then squeeze shoulder blades together. Repeat ____ times. Do ____ sessions per day.  Upper Back Strength: Lower Trapezius / Rotator Cuff " L's "     Arms in waitress pose, palms up. Press hands back and slide shoulder blades down. Hold for __5__ seconds. Repeat _10___ times. 1-2 times per day.    Scapular Retraction: Elbow Flexion (Standing)  "W's"     With elbows bent to 90, pinch shoulder blades together and rotate arms out, keeping elbows bent. Repeat __10__ times per set. Do __1-2__ sets per session. Do _several ___ sessions per  day.  Scapula Adduction With Pectoralis Stretch: Low - Standing   Shoulders at 45 hands even with shoulders, keeping weight through legs, shift weight forward until you feel pull or stretch through the front of your chest. Hold _30__ seconds. Do _3__ times, _2-4__ times per day.   Scapula Adduction With Pectoralis Stretch: Mid-Range - Standing   Shoulders at 90 elbows even with shoulders, keeping weight through legs, shift weight forward until you feel pull or strength through the front of your chest. Hold __30_ seconds. Do _3__ times, __2-4_ times per day.   Scapula Adduction With Pectoralis Stretch: High - Standing   Shoulders at 120 hands up high on the doorway, keeping weight on feet, shift weight forward until you feel pull or stretch through the front of your chest. Hold _30__ seconds. Do _3__ times, _2-3__ times per day.   [facility] [address] [place], [place] [address]  [phone] (office) [phone] (fax)
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
## 2018-08-08 ENCOUNTER — Ambulatory Visit: Payer: BLUE CROSS/BLUE SHIELD | Admitting: Physical Therapy

## 2018-08-08 DIAGNOSIS — R29898 Other symptoms and signs involving the musculoskeletal system: Secondary | ICD-10-CM | POA: Diagnosis not present

## 2018-08-08 DIAGNOSIS — R293 Abnormal posture: Secondary | ICD-10-CM

## 2018-08-08 DIAGNOSIS — M542 Cervicalgia: Secondary | ICD-10-CM | POA: Diagnosis not present
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
## 2018-08-10 ENCOUNTER — Encounter: Payer: BLUE CROSS/BLUE SHIELD | Admitting: Rehabilitative and Restorative Service Providers"
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
## 2020-10-16 ENCOUNTER — Encounter: Payer: Self-pay | Admitting: Nurse Practitioner

## 2020-10-16 ENCOUNTER — Other Ambulatory Visit: Payer: Self-pay | Admitting: Nurse Practitioner

## 2020-10-16 ENCOUNTER — Ambulatory Visit (INDEPENDENT_AMBULATORY_CARE_PROVIDER_SITE_OTHER): Payer: BLUE CROSS/BLUE SHIELD | Admitting: Nurse Practitioner

## 2020-10-16 DIAGNOSIS — U071 COVID-19: Secondary | ICD-10-CM | POA: Diagnosis not present

## 2020-10-16 DIAGNOSIS — J0110 Acute frontal sinusitis, unspecified: Secondary | ICD-10-CM | POA: Diagnosis not present

## 2020-10-16 MED ORDER — HYDROCOD POLST-CPM POLST ER 10-8 MG/5ML PO SUER: 5.0000 mL | Freq: Every evening | ORAL | 0 refills | Status: DC | PRN

## 2020-10-16 MED ORDER — HYDROCOD POLST-CPM POLST ER 10-8 MG/5ML PO SUER: 5.0000 mL | Freq: Every evening | ORAL | 0 refills | Status: AC | PRN

## 2020-10-16 MED ORDER — AMOXICILLIN-POT CLAVULANATE 875-125 MG PO TABS: 1.0000 | ORAL_TABLET | Freq: Two times a day (BID) | ORAL | 0 refills | Status: AC

## 2020-10-16 NOTE — Patient Instructions (Signed)
I recommend using over the counter medications to help with symptoms.  Medications with the following ingredients may be helpful for the symptoms listed: Cough: dextromethorphan Congestion: guaifenesin Runny nose/Congestion: pseudoephedrine, claritin, zyrtec, benadryl Congestion/Cough: mentholated chest rub Pain/Fever/Body Aches/Headache: ibuprofen and acetamenophen  Herbal treatments that have been shown to be helpful in some patients include: Vitamin C 1000mg per day Vitamin D 4000iU per day Zinc 100mg per day Quercetin 25-500mg twice a day Melatonin 5-10mg at bedtime  Please continue to quarantine for 10 days from the first day of your exposure- since the symptoms have been ongoing for several weeks, it is possible that you were positive at the time of the exposure and just were not aware.   I have called in cough medication and an antibiotic. If your symptoms are still not improving over the weekend, you can start the antibiotic. The cough medication is best taken at bedtime, but one additional dose can be taken 12 hours apart to help with resistant symptoms during the day- this will make you sleepy, so be sure you do not drive or operate machinery while taking this.  

## 2020-10-16 NOTE — Progress Notes (Signed)
Virtual Visit via Telephone Note  I connected with  Sonya Lucas on 10/16/20 at  9:30 AM EST by telephone and verified that I am speaking with the correct person using two identifiers.   I discussed the limitations, risks, security and privacy concerns of performing an evaluation and management service by telephone and the availability of in person appointments. I also discussed with the patient that there may be a patient responsible charge related to this service. The patient expressed understanding and agreed to proceed.  Participating parties included in this telephone visit include: The patient listed, her husband Minerva Areola, and the nurse practitioner The patient is: at home I am: in the office  Subjective:    CC: COVID-19 and sinusitis  HPI: HPI: Sonya Lucas is a 44 year old female presenting via MyChart telephone visti today for COVID-19 positive results and sinus pressure x3 weeks.    About 3 weeks ago patient reports she started having an allergy exacerbation which is common this time of year. She took a home COVID test at that time and it was negative.  On Saturday afternoon she and her husband were visiting a friend. They learned yesterday afternoon that the friend they were visiting had tested positive for COVID-19. She took a home test last night and it was positive for COVID.   She endorses sinus pain and pressure, headache, some body aches, post-nasal drip, fatigue, cough, and rhinorrhea.   She denies fever, chills, shortness of breath, chest pain, palpitations, loss of taste, loss of smell, dizziness, nausea, vomiting, diarrhea, or wheezing.   She has been using over the counter cold and flu medication and it is helpful for her symptoms, but they do return full force as the medication wears off.   Past medical history, Surgical history, Family history not pertinant except as noted below, Social history, Allergies, and medications have been entered into the medical record,  reviewed, and corrections made.   Review of Systems:  All review of systems negative except what is listed in the HPI  Objective:    General: Speaking clearly in complete sentences without any shortness of breath.   She is audibly congested with a dry cough.  Alert and oriented x3.   Normal judgment.  No apparent acute distress.  Impression and Recommendations:   1. COVID-19 2. Acute non-recurrent frontal sinusitis Symptoms and presentation consistent with COVID-19 infection. Given the initial onset of allergy and sinus symptoms for about 3 weeks, a diagnosis of sinusitis is also pertinent to this visit.  Discussed quarantine period and precautions against viral spread for household members with patient and her husband.  Recommend OTC treatment with mucinex, flonase, vitamin c, vitamin d, zinc, quercetin, and melatonin to help with symptom management.  Recommend increased rest and hydration.  Since the symptom onset is not exactly clear, we will plan for quarantine for at least 10 days from the known exposure as it is possible she was positive at the time of the visit with her friend and was not aware.  Will start augmentin for sinusitis if symptoms persist over the weekend given the length of time symptoms have been present.  Tussionex provided for cough and congestion for bedtime use.  Follow-up if symptoms worsen or fail to improve.  - amoxicillin-clavulanate (AUGMENTIN) 875-125 MG tablet; Take 1 tablet by mouth 2 (two) times daily.  Dispense: 10 tablet; Refill: 0 - chlorpheniramine-HYDROcodone (TUSSIONEX) 10-8 MG/5ML SUER; Take 5 mLs by mouth at bedtime as needed for cough (cough, will  cause drowsiness.).  Dispense: 70 mL; Refill: 0  (medications sent in separate encounter due to add on visit)   I discussed the assessment and treatment plan with the patient. The patient was provided an opportunity to ask questions and all were answered. The patient agreed with the plan and  demonstrated an understanding of the instructions.   The patient was advised to call back or seek an in-person evaluation if the symptoms worsen or if the condition fails to improve as anticipated.  I provided 10 minutes of non-face-to-face time during this TELEPHONE encounter.    Tollie Eth, NP
